# Patient Record
Sex: Female | Born: 1945
Health system: Southern US, Community
[De-identification: ages and names within clinical notes are randomized; demographics above are authoritative.]

## PROBLEM LIST (undated history)

## (undated) DIAGNOSIS — I10 Essential (primary) hypertension: Secondary | ICD-10-CM

## (undated) HISTORY — PX: BUNIONECTOMY: SHX129

---

## 1972-09-22 HISTORY — PX: OTHER SURGICAL HISTORY: SHX169

## 2003-08-15 ENCOUNTER — Encounter: Admission: RE | Admit: 2003-08-15 | Discharge: 2003-08-15 | Payer: Self-pay | Admitting: Internal Medicine

## 2004-04-23 ENCOUNTER — Encounter: Admission: RE | Admit: 2004-04-23 | Discharge: 2004-04-23 | Payer: Self-pay | Admitting: Internal Medicine

## 2005-02-12 ENCOUNTER — Encounter: Admission: RE | Admit: 2005-02-12 | Discharge: 2005-02-12 | Payer: Self-pay | Admitting: Internal Medicine

## 2005-06-19 ENCOUNTER — Encounter: Admission: RE | Admit: 2005-06-19 | Discharge: 2005-06-19 | Payer: Self-pay | Admitting: Internal Medicine

## 2005-09-09 ENCOUNTER — Encounter: Admission: RE | Admit: 2005-09-09 | Discharge: 2005-09-09 | Payer: Self-pay | Admitting: Obstetrics & Gynecology

## 2006-05-14 ENCOUNTER — Encounter: Admission: RE | Admit: 2006-05-14 | Discharge: 2006-05-14 | Payer: Self-pay | Admitting: Internal Medicine

## 2006-05-18 ENCOUNTER — Emergency Department (HOSPITAL_COMMUNITY): Admission: EM | Admit: 2006-05-18 | Discharge: 2006-05-18 | Payer: Self-pay | Admitting: Emergency Medicine

## 2006-10-30 ENCOUNTER — Encounter: Admission: RE | Admit: 2006-10-30 | Discharge: 2006-10-30 | Payer: Self-pay | Admitting: Gastroenterology

## 2006-11-09 ENCOUNTER — Encounter: Admission: RE | Admit: 2006-11-09 | Discharge: 2006-11-09 | Payer: Self-pay | Admitting: Gastroenterology

## 2008-03-09 ENCOUNTER — Encounter: Admission: RE | Admit: 2008-03-09 | Discharge: 2008-03-09 | Payer: Self-pay | Admitting: Internal Medicine

## 2008-09-01 ENCOUNTER — Encounter: Admission: RE | Admit: 2008-09-01 | Discharge: 2008-09-01 | Payer: Self-pay | Admitting: Orthopedic Surgery

## 2009-03-16 ENCOUNTER — Encounter: Admission: RE | Admit: 2009-03-16 | Discharge: 2009-03-16 | Payer: Self-pay | Admitting: Family Medicine

## 2009-07-12 ENCOUNTER — Emergency Department (HOSPITAL_COMMUNITY): Admission: EM | Admit: 2009-07-12 | Discharge: 2009-07-12 | Payer: Self-pay | Admitting: Emergency Medicine

## 2009-08-21 ENCOUNTER — Ambulatory Visit: Payer: Self-pay | Admitting: Cardiology

## 2009-08-21 DIAGNOSIS — R002 Palpitations: Secondary | ICD-10-CM | POA: Insufficient documentation

## 2009-08-21 DIAGNOSIS — R072 Precordial pain: Secondary | ICD-10-CM

## 2009-08-21 DIAGNOSIS — E669 Obesity, unspecified: Secondary | ICD-10-CM

## 2009-08-21 DIAGNOSIS — I10 Essential (primary) hypertension: Secondary | ICD-10-CM

## 2009-08-24 ENCOUNTER — Encounter: Admission: RE | Admit: 2009-08-24 | Discharge: 2009-08-24 | Payer: Self-pay | Admitting: Orthopaedic Surgery

## 2009-08-28 ENCOUNTER — Encounter: Admission: RE | Admit: 2009-08-28 | Discharge: 2009-09-19 | Payer: Self-pay | Admitting: Obstetrics & Gynecology

## 2009-10-11 ENCOUNTER — Encounter: Admission: RE | Admit: 2009-10-11 | Discharge: 2010-01-09 | Payer: Self-pay | Admitting: Obstetrics & Gynecology

## 2009-10-12 ENCOUNTER — Ambulatory Visit: Payer: Self-pay

## 2009-10-12 ENCOUNTER — Ambulatory Visit: Payer: Self-pay | Admitting: Cardiology

## 2009-10-18 ENCOUNTER — Telehealth (INDEPENDENT_AMBULATORY_CARE_PROVIDER_SITE_OTHER): Payer: Self-pay

## 2009-10-22 ENCOUNTER — Ambulatory Visit: Payer: Self-pay

## 2009-10-22 ENCOUNTER — Encounter (HOSPITAL_COMMUNITY): Admission: RE | Admit: 2009-10-22 | Discharge: 2009-12-26 | Payer: Self-pay | Admitting: Cardiology

## 2009-10-22 ENCOUNTER — Ambulatory Visit: Payer: Self-pay | Admitting: Cardiology

## 2009-10-29 ENCOUNTER — Ambulatory Visit: Payer: Self-pay

## 2010-01-24 ENCOUNTER — Encounter: Admission: RE | Admit: 2010-01-24 | Discharge: 2010-01-24 | Payer: Self-pay | Admitting: Family Medicine

## 2010-04-03 ENCOUNTER — Encounter: Admission: RE | Admit: 2010-04-03 | Discharge: 2010-04-03 | Payer: Self-pay | Admitting: Family Medicine

## 2010-10-22 NOTE — Assessment & Plan Note (Signed)
Summary: Cardiology Nuclear Study  Nuclear Med Background Indications for Stress Test: Evaluation for Ischemia  Indications Comments: 10/10 [facility] with CP, (-) enzymes.  History: Asthma, GXT  History Comments: 10/12/09 GXT: Unable to complete.  Symptoms: Chest Pain, DOE, Palpitations    Nuclear Pre-Procedure Cardiac Risk Factors: Family History - CAD, Hypertension, NIDDM, Obesity, Smoker Caffeine/Decaff Intake: None NPO After: 9:00 PM Lungs: clear IV 0.9% NS with Angio Cath: 22g     IV Site: (R) AC IV Started by: Irean Hong RN Chest Size (in) 44     Cup Size DD     Height (in): 62 Weight (lb): 271 BMI: 49.75  Nuclear Med Study 1 or 2 day study:  2 day     Stress Test Type:  Eugenie Birks Reading MD:  Olga Millers, MD     Referring MD:  J.Hochrein; cc to Renaye Rakers Resting Radionuclide:  Technetium 74m Tetrofosmin     Resting Radionuclide Dose:  33.0 mCi  Stress Radionuclide:  Technetium 64m Tetrofosmin     Stress Radionuclide Dose:  33.0 mCi   Stress Protocol   Lexiscan: 0.4 mg   Stress Test Technologist:  Milana Na EMT-P     Nuclear Technologist:  Burna Mortimer Deal RT-N  Rest Procedure  Myocardial perfusion imaging was performed at rest 45 minutes following the intravenous administration of Myoview Technetium 68m Tetrofosmin.  Stress Procedure  The patient received IV Lexiscan 0.4 mg over 15-seconds.  Myoview injected at 30-seconds.  There were no significant changes, + headache, sob, and chest tightness with infusion.  Quantitative spect images were obtained after a 45 minute delay.  QPS Raw Data Images:  There is interference from nuclear activity from structures below the diaphragm.  This does not affect the ability to read the study. Stress Images:  There is normal uptake in all areas. Rest Images:  Normal homogeneous uptake in all areas of the myocardium. Subtraction (SDS):  No evidence of ischemia. Transient Ischemic Dilatation:  .95  (Normal <1.22)  Lung/Heart  Ratio:  .30  (Normal <0.45)  Quantitative Gated Spect Images QGS EDV:  98 ml QGS ESV:  26 ml QGS EF:  73 % QGS cine images:  Normal wall motion.   Overall Impression  Exercise Capacity: Lexiscan study with no exercise. BP Response: Normal blood pressure response. Clinical Symptoms: There is chest pain ECG Impression: No significant ST segment change suggestive of ischemia. Overall Impression: There is no sign of scar or ischemia.  Appended Document: Cardiology Nuclear Study Negative study.  No further work up planned at this point.  Appended Document: Cardiology Nuclear Study PT AWARE OF THE RESULTS.

## 2010-10-22 NOTE — Progress Notes (Signed)
Summary: Nuc. Pre-Procedure  Phone Note Outgoing Call Call back at [facility] Phone [phone]   Call placed by: Irean Hong, RN,  October 18, 2009 1:48 PM Summary of Call: Reviewed information on Myoview Information Sheet (see scanned document for further details).  Spoke with patient.     Nuclear Med Background Indications for Stress Test: Evaluation for Ischemia  Indications Comments: 10/10 [facility] with CP, (-) enzymes.  History: Asthma, GXT  History Comments: 10/12/09 GXT: Unable to complete.  Symptoms: Chest Pain, DOE, Palpitations    Nuclear Pre-Procedure Cardiac Risk Factors: Family History - CAD, Hypertension, NIDDM, Obesity, Smoker Height (in): 62

## 2010-12-26 LAB — COMPREHENSIVE METABOLIC PANEL
ALT: 21 U/L (ref 0–35)
AST: 31 U/L (ref 0–37)
Albumin: 3.6 g/dL (ref 3.5–5.2)
Alkaline Phosphatase: 73 U/L (ref 39–117)
BUN: 19 mg/dL (ref 6–23)
CO2: 28 mEq/L (ref 19–32)
Calcium: 9 mg/dL (ref 8.4–10.5)
Chloride: 99 mEq/L (ref 96–112)
Creatinine, Ser: 0.83 mg/dL (ref 0.4–1.2)
GFR calc Af Amer: >60 mL/min (ref >60)
GFR calc non Af Amer: >60 mL/min (ref >60)
Glucose, Bld: 94 mg/dL (ref 70–99)
Potassium: 3.8 mEq/L (ref 3.5–5.1)
Sodium: 135 mEq/L (ref 135–145)
Total Bilirubin: 0.8 mg/dL (ref 0.3–1.2)
Total Protein: 7.4 g/dL (ref 6.0–8.3)

## 2010-12-26 LAB — DIFFERENTIAL
Basophils Absolute: 0 10*3/uL (ref 0.0–0.1)
Basophils Relative: 0 % (ref 0–1)
Eosinophils Absolute: 0.2 10*3/uL (ref 0.0–0.7)
Eosinophils Relative: 3 % (ref 0–5)
Lymphocytes Relative: 46 % (ref 12–46)
Lymphs Abs: 4.4 10*3/uL — ABNORMAL HIGH (ref 0.7–4.0)
Monocytes Absolute: 0.7 10*3/uL (ref 0.1–1.0)
Monocytes Relative: 8 % (ref 3–12)
Neutro Abs: 4.2 10*3/uL (ref 1.7–7.7)
Neutrophils Relative %: 44 % (ref 43–77)

## 2010-12-26 LAB — CBC
HCT: 41.2 % (ref 36.0–46.0)
Hemoglobin: 13.7 g/dL (ref 12.0–15.0)
MCHC: 33.2 g/dL (ref 30.0–36.0)
MCV: 86.5 fL (ref 78.0–100.0)
Platelets: 284 10*3/uL (ref 150–400)
RBC: 4.76 MIL/uL (ref 3.87–5.11)
RDW: 14.4 % (ref 11.5–15.5)
WBC: 9.6 10*3/uL (ref 4.0–10.5)

## 2010-12-26 LAB — POCT CARDIAC MARKERS
CKMB, poc: 1.4 ng/mL (ref 1.0–8.0)
CKMB, poc: <1 ng/mL — ABNORMAL LOW (ref 1.0–8.0)
Myoglobin, poc: 133 ng/mL (ref 12–200)
Myoglobin, poc: 139 ng/mL (ref 12–200)
Troponin i, poc: <0.05 ng/mL (ref 0.00–0.09)
Troponin i, poc: <0.05 ng/mL (ref 0.00–0.09)

## 2010-12-26 LAB — D-DIMER, QUANTITATIVE: D-Dimer, Quant: 0.85 ug/mL-FEU — ABNORMAL HIGH (ref 0.00–0.48)

## 2011-04-05 ENCOUNTER — Emergency Department (HOSPITAL_COMMUNITY)
Admission: EM | Admit: 2011-04-05 | Discharge: 2011-04-05 | Disposition: A | Discharge status: Home / Self Care | Payer: Managed Care, Other (non HMO) | Attending: Emergency Medicine | Admitting: Emergency Medicine

## 2011-04-05 ENCOUNTER — Emergency Department (HOSPITAL_COMMUNITY): Payer: Managed Care, Other (non HMO)

## 2011-04-05 DIAGNOSIS — M25569 Pain in unspecified knee: Secondary | ICD-10-CM | POA: Insufficient documentation

## 2011-04-05 DIAGNOSIS — IMO0002 Reserved for concepts with insufficient information to code with codable children: Secondary | ICD-10-CM | POA: Insufficient documentation

## 2011-04-05 DIAGNOSIS — M1710 Unilateral primary osteoarthritis, unspecified knee: Secondary | ICD-10-CM | POA: Insufficient documentation

## 2011-05-05 ENCOUNTER — Other Ambulatory Visit: Payer: Self-pay | Admitting: Family Medicine

## 2011-05-05 DIAGNOSIS — Z1231 Encounter for screening mammogram for malignant neoplasm of breast: Secondary | ICD-10-CM

## 2011-05-15 ENCOUNTER — Ambulatory Visit
Admission: RE | Admit: 2011-05-15 | Discharge: 2011-05-15 | Disposition: A | Discharge status: Home / Self Care | Payer: Managed Care, Other (non HMO) | Source: Ambulatory Visit | Attending: Family Medicine | Admitting: Family Medicine

## 2011-05-15 DIAGNOSIS — Z1231 Encounter for screening mammogram for malignant neoplasm of breast: Secondary | ICD-10-CM

## 2011-06-20 ENCOUNTER — Emergency Department (HOSPITAL_COMMUNITY): Payer: Managed Care, Other (non HMO)

## 2011-06-20 ENCOUNTER — Emergency Department (HOSPITAL_COMMUNITY)
Admission: EM | Admit: 2011-06-20 | Discharge: 2011-06-20 | Disposition: A | Discharge status: Home / Self Care | Payer: Managed Care, Other (non HMO) | Attending: Emergency Medicine | Admitting: Emergency Medicine

## 2011-06-20 DIAGNOSIS — R110 Nausea: Secondary | ICD-10-CM | POA: Insufficient documentation

## 2011-06-20 DIAGNOSIS — R1013 Epigastric pain: Secondary | ICD-10-CM | POA: Insufficient documentation

## 2011-06-20 DIAGNOSIS — I10 Essential (primary) hypertension: Secondary | ICD-10-CM | POA: Insufficient documentation

## 2011-06-20 DIAGNOSIS — R1011 Right upper quadrant pain: Secondary | ICD-10-CM | POA: Insufficient documentation

## 2011-06-20 DIAGNOSIS — R509 Fever, unspecified: Secondary | ICD-10-CM | POA: Insufficient documentation

## 2011-06-20 LAB — CBC
HCT: 38.6 % (ref 36.0–46.0)
MCHC: 33.4 g/dL (ref 30.0–36.0)
MCV: 84.5 fL (ref 78.0–100.0)
Platelets: 251 10*3/uL (ref 150–400)
RDW: 14.4 % (ref 11.5–15.5)
WBC: 11.6 10*3/uL — ABNORMAL HIGH (ref 4.0–10.5)

## 2011-06-20 LAB — URINALYSIS, ROUTINE W REFLEX MICROSCOPIC
Glucose, UA: NEGATIVE mg/dL
Nitrite: NEGATIVE
Specific Gravity, Urine: 1.022 (ref 1.005–1.030)
pH: 6 (ref 5.0–8.0)

## 2011-06-20 LAB — COMPREHENSIVE METABOLIC PANEL
AST: 44 U/L — ABNORMAL HIGH (ref 0–37)
Albumin: 3.4 g/dL — ABNORMAL LOW (ref 3.5–5.2)
BUN: 16 mg/dL (ref 6–23)
Chloride: 97 mEq/L (ref 96–112)
Creatinine, Ser: 0.72 mg/dL (ref 0.50–1.10)
Total Bilirubin: 0.6 mg/dL (ref 0.3–1.2)
Total Protein: 7.4 g/dL (ref 6.0–8.3)

## 2011-06-20 LAB — LIPASE, BLOOD: Lipase: 51 U/L (ref 11–59)

## 2011-06-20 LAB — DIFFERENTIAL
Basophils Absolute: 0 10*3/uL (ref 0.0–0.1)
Eosinophils Absolute: 0.1 10*3/uL (ref 0.0–0.7)
Eosinophils Relative: 1 % (ref 0–5)
Monocytes Absolute: 0.9 10*3/uL (ref 0.1–1.0)

## 2011-06-20 LAB — URINE MICROSCOPIC-ADD ON

## 2011-11-05 ENCOUNTER — Other Ambulatory Visit: Payer: Self-pay | Admitting: Gastroenterology

## 2011-11-10 ENCOUNTER — Ambulatory Visit
Admission: RE | Admit: 2011-11-10 | Discharge: 2011-11-10 | Disposition: A | Discharge status: Home / Self Care | Payer: Medicare Other | Source: Ambulatory Visit | Attending: Gastroenterology | Admitting: Gastroenterology

## 2011-11-10 DIAGNOSIS — R945 Abnormal results of liver function studies: Secondary | ICD-10-CM | POA: Diagnosis not present

## 2011-11-12 ENCOUNTER — Other Ambulatory Visit: Payer: Self-pay | Admitting: Gastroenterology

## 2011-11-12 DIAGNOSIS — N289 Disorder of kidney and ureter, unspecified: Secondary | ICD-10-CM

## 2011-11-19 ENCOUNTER — Ambulatory Visit
Admission: RE | Admit: 2011-11-19 | Discharge: 2011-11-19 | Disposition: A | Discharge status: Home / Self Care | Payer: Medicare Other | Source: Ambulatory Visit | Attending: Gastroenterology | Admitting: Gastroenterology

## 2011-11-19 DIAGNOSIS — N289 Disorder of kidney and ureter, unspecified: Secondary | ICD-10-CM | POA: Diagnosis not present

## 2011-11-19 MED ORDER — IOHEXOL 300 MG/ML  SOLN
125.0000 mL | Freq: Once | INTRAMUSCULAR | Status: AC | PRN
  Administered 2011-11-19: 125 mL via INTRAVENOUS

## 2012-01-14 ENCOUNTER — Encounter (HOSPITAL_COMMUNITY): Payer: Self-pay | Admitting: *Deleted

## 2012-01-14 ENCOUNTER — Emergency Department (HOSPITAL_COMMUNITY): Payer: Medicare Other

## 2012-01-14 ENCOUNTER — Emergency Department (HOSPITAL_COMMUNITY)
Admission: EM | Admit: 2012-01-14 | Discharge: 2012-01-15 | Disposition: A | Discharge status: Home / Self Care | Payer: Medicare Other | Attending: Emergency Medicine | Admitting: Emergency Medicine

## 2012-01-14 DIAGNOSIS — M542 Cervicalgia: Secondary | ICD-10-CM | POA: Diagnosis not present

## 2012-01-14 DIAGNOSIS — J45909 Unspecified asthma, uncomplicated: Secondary | ICD-10-CM | POA: Insufficient documentation

## 2012-01-14 DIAGNOSIS — R51 Headache: Secondary | ICD-10-CM | POA: Insufficient documentation

## 2012-01-14 DIAGNOSIS — S41009A Unspecified open wound of unspecified shoulder, initial encounter: Secondary | ICD-10-CM | POA: Diagnosis not present

## 2012-01-14 DIAGNOSIS — S0990XA Unspecified injury of head, initial encounter: Secondary | ICD-10-CM | POA: Diagnosis not present

## 2012-01-14 DIAGNOSIS — W010XXA Fall on same level from slipping, tripping and stumbling without subsequent striking against object, initial encounter: Secondary | ICD-10-CM | POA: Insufficient documentation

## 2012-01-14 DIAGNOSIS — G4730 Sleep apnea, unspecified: Secondary | ICD-10-CM | POA: Diagnosis not present

## 2012-01-14 DIAGNOSIS — S0083XA Contusion of other part of head, initial encounter: Secondary | ICD-10-CM | POA: Insufficient documentation

## 2012-01-14 DIAGNOSIS — S1093XA Contusion of unspecified part of neck, initial encounter: Secondary | ICD-10-CM | POA: Diagnosis not present

## 2012-01-14 DIAGNOSIS — I10 Essential (primary) hypertension: Secondary | ICD-10-CM | POA: Insufficient documentation

## 2012-01-14 DIAGNOSIS — Y9210 Unspecified residential institution as the place of occurrence of the external cause: Secondary | ICD-10-CM | POA: Insufficient documentation

## 2012-01-14 DIAGNOSIS — S0003XA Contusion of scalp, initial encounter: Secondary | ICD-10-CM | POA: Diagnosis not present

## 2012-01-14 DIAGNOSIS — S022XXA Fracture of nasal bones, initial encounter for closed fracture: Secondary | ICD-10-CM | POA: Diagnosis not present

## 2012-01-14 DIAGNOSIS — W19XXXA Unspecified fall, initial encounter: Secondary | ICD-10-CM

## 2012-01-14 HISTORY — DX: Essential (primary) hypertension: I10

## 2012-01-14 MED ORDER — KETOROLAC TROMETHAMINE 30 MG/ML IJ SOLN
30.0000 mg | Freq: Once | INTRAMUSCULAR | Status: AC
  Administered 2012-01-15: 30 mg via INTRAMUSCULAR
  Filled 2012-01-14: qty 1

## 2012-01-14 NOTE — ED Notes (Signed)
EMS reports pt was visiting family at nursing home, pt tripped and fell face first on floor, no LOC, hematoma to forehead, abrasions

## 2012-01-14 NOTE — ED Notes (Signed)
Patient states that she was walking and tripped and fell. Unsure if she blacked out. States she fell face forward and hit a tile cement floor. Hematoma noted to forehead.

## 2012-01-14 NOTE — ED Notes (Signed)
ZOX:[id number]<AV> Expected date:01/14/12<BR> Expected time:<BR> Means of arrival:<BR> Comments:<BR> EMS 130 GC - general illness

## 2012-01-14 NOTE — ED Provider Notes (Signed)
History     CSN: [id number]  Arrival date & time 01/14/12  2204   First MD Initiated Contact with Patient 01/14/12 2256      Chief Complaint  Patient presents with  . Fall    HPI  History provided by the patient. Patient 66 year old female with history of hypertension and sleep apnea who presents after a fall. Patient reports visiting her mother at nursing home and while she was helping get a snack she stumbled or tripped with her feet getting caught up on something in the room and fell forward. Patient reports landing on her chest and hitting her forehead against the ground. Patient denies LOC. Patient complains of swelling to her for head above her nose area. Patient also reports having a bloody nose following the injury. Bleeding has stopped. Patient denies any other significant injury. She denies neck pain. She denies any confusion, numbness or weakness in extremities. Denies vision problems. Patient has not done anything for pain. Denies any aggravating or alleviating factors    Past Medical History  Diagnosis Date  . Hypertension   . Asthma   . Sleep apnea   . CPAP (continuous positive airway pressure) dependence     Past Surgical History  Procedure Date  . Bunionectomy     No family history on file.  History  Substance Use Topics  . Smoking status: Former Games developer  . Smokeless tobacco: Not on file  . Alcohol Use: No    OB History    Grav Para Term Preterm Abortions TAB SAB Ect Mult Living                  Review of Systems  HENT: Positive for nosebleeds and neck stiffness. Negative for neck pain.   Eyes: Negative for pain and visual disturbance.  Gastrointestinal: Negative for nausea and vomiting.  Neurological: Positive for headaches. Negative for dizziness, facial asymmetry, speech difficulty, weakness, light-headedness and numbness.  Psychiatric/Behavioral: Negative for confusion.    Allergies  Review of patient's allergies indicates no known  allergies.  Home Medications  No current outpatient prescriptions on file.  BP 144/70  Pulse 61  Temp(Src) 98.5 F (36.9 C) (Oral)  Resp 18  Wt 262 lb 8 oz (119.069 kg)  SpO2 99%  Physical Exam  Nursing note and vitals reviewed. Constitutional: She is oriented to person, place, and time. She appears well-developed and well-nourished. No distress.  HENT:  Head: Normocephalic.       4 cm hematoma to forehead. Small abrasions without bleeding. Pain over nasal bones without obvious deformity. Dry blood in the nostrils bilaterally. No septal hematoma. No active bleeding. No raccoon eyes or battle sign.  Eyes: Conjunctivae and EOM are normal. Pupils are equal, round, and reactive to light.  Neck: Normal range of motion. Neck supple.       Mild posterior tenderness.  Cardiovascular: Normal rate and regular rhythm.   Pulmonary/Chest: Effort normal and breath sounds normal. No respiratory distress. She has no wheezes.  Neurological: She is alert and oriented to person, place, and time. She has normal strength. No cranial nerve deficit or sensory deficit.  Skin: Skin is warm and dry. No rash noted.  Psychiatric: She has a normal mood and affect. Her behavior is normal.    ED Course  Procedures     Ct Head Wo Contrast  01/15/2012  *RADIOLOGY REPORT*  Clinical Data:  Fall.  CT HEAD WITHOUT CONTRAST CT CERVICAL SPINE WITHOUT CONTRAST  Technique:  Multidetector CT imaging  of the head and cervical spine was performed following the standard protocol without intravenous contrast.  Multiplanar CT image reconstructions of the cervical spine were also generated.  Comparison:   None  CT HEAD  Findings: The ventricles and sulci appear symmetrical.  No mass effect or midline shift.  No abnormal extra-axial fluid collections.  Vague focal area of low attenuation in the right insular cortex region may represent focal area of ischemia.  Age is indeterminate.  Gray-white matter junctions are distinct.  Basal  cisterns are not effaced.  No evidence of acute intracranial hemorrhage.  Subcutaneous scalp hematoma over the anterior frontal bone without underlying skull fracture.  Visualized paranasal sinuses and mastoid air cells are not opacified.  Mild depression of nasal bone fracture.  IMPRESSION: Focal subcutaneous scalp hematoma.  Depressed nasal bone fracture. No acute intracranial abnormalities.  Nonspecific focal low attenuation change in the right insular cortex may represent ischemia of unknown etiology.  No acute intracranial hemorrhage.  CT CERVICAL SPINE  Findings: There is reversal of the usual cervical lordosis, centered at the C5 level.  This may be due to patient positioning or degenerative change but ligamentous injury or muscle spasm can also have this appearance.  Normal alignment of the facet joints. No abnormal anterior subluxation.  Degenerative changes in the cervical spine with narrowed C4-5, C5-6, and C6-7 interspaces and associated endplate hypertrophic changes.  Prominent posterior hypertroph of the superior endplate of C6 causes some effacement the anterior thecal sac.  No vertebral compression deformities.  No prevertebral soft tissue swelling.  Lateral masses of C1 are symmetrical.  The odontoid process is intact.  Degenerative changes at C1-2 with pannus formation.  No focal bone lesion or bone destruction.  No paraspinal soft tissue infiltration.  Bone cortex and trabecular architecture appear intact.  IMPRESSION: Reversal of the usual cervical lordosis which might be due to patient positioning but ligamentous injury or muscle spasm can also have this appearance.  Degenerative changes in the cervical spine. No displaced fractures identified.  Original Report Authenticated By: Marlon Pel, M.D.   Ct Cervical Spine Wo Contrast  01/15/2012  *RADIOLOGY REPORT*  Clinical Data:  Fall.  CT HEAD WITHOUT CONTRAST CT CERVICAL SPINE WITHOUT CONTRAST  Technique:  Multidetector CT imaging of the  head and cervical spine was performed following the standard protocol without intravenous contrast.  Multiplanar CT image reconstructions of the cervical spine were also generated.  Comparison:   None  CT HEAD  Findings: The ventricles and sulci appear symmetrical.  No mass effect or midline shift.  No abnormal extra-axial fluid collections.  Vague focal area of low attenuation in the right insular cortex region may represent focal area of ischemia.  Age is indeterminate.  Gray-white matter junctions are distinct.  Basal cisterns are not effaced.  No evidence of acute intracranial hemorrhage.  Subcutaneous scalp hematoma over the anterior frontal bone without underlying skull fracture.  Visualized paranasal sinuses and mastoid air cells are not opacified.  Mild depression of nasal bone fracture.  IMPRESSION: Focal subcutaneous scalp hematoma.  Depressed nasal bone fracture. No acute intracranial abnormalities.  Nonspecific focal low attenuation change in the right insular cortex may represent ischemia of unknown etiology.  No acute intracranial hemorrhage.  CT CERVICAL SPINE  Findings: There is reversal of the usual cervical lordosis, centered at the C5 level.  This may be due to patient positioning or degenerative change but ligamentous injury or muscle spasm can also have this appearance.  Normal alignment of  the facet joints. No abnormal anterior subluxation.  Degenerative changes in the cervical spine with narrowed C4-5, C5-6, and C6-7 interspaces and associated endplate hypertrophic changes.  Prominent posterior hypertroph of the superior endplate of C6 causes some effacement the anterior thecal sac.  No vertebral compression deformities.  No prevertebral soft tissue swelling.  Lateral masses of C1 are symmetrical.  The odontoid process is intact.  Degenerative changes at C1-2 with pannus formation.  No focal bone lesion or bone destruction.  No paraspinal soft tissue infiltration.  Bone cortex and trabecular  architecture appear intact.  IMPRESSION: Reversal of the usual cervical lordosis which might be due to patient positioning but ligamentous injury or muscle spasm can also have this appearance.  Degenerative changes in the cervical spine. No displaced fractures identified.  Original Report Authenticated By: Marlon Pel, M.D.     1. Scalp hematoma   2. Fall   3. Nasal bone fracture       MDM  11;00PM patient seen and evaluated. Patient in no acute distress. Patient with normal nonfocal neuro exam.        Angus Seller, PA 01/15/12 [phone]

## 2012-01-15 MED ORDER — HYDROCODONE-ACETAMINOPHEN 5-325 MG PO TABS: 1.0000 | ORAL_TABLET | ORAL | Status: AC | PRN

## 2012-01-15 NOTE — ED Provider Notes (Signed)
Medical screening examination/treatment/procedure(s) were performed by non-physician practitioner and as supervising physician I was immediately available for consultation/collaboration.   Forbes Cellar, MD 01/15/12 [phone]

## 2012-01-15 NOTE — ED Notes (Signed)
PA Dammen at bedside. 

## 2012-01-15 NOTE — Discharge Instructions (Signed)
Your CAT scans today showed a slight nasal bone fracture. There is no other skull fracture or brain injury. At this time your providers he may return home. Use ice over your swelling. Use the pain medications were prescribed as instructed for severe pains. Do not drive or operate machinery while taking this pain medication. Please followup with a primary care provider. Return for any worsening or new concerning symptoms.   Head Injury, Adult You have had a head injury that does not appear serious at this time. A concussion is a state of changed mental ability, usually from a blow to the head. You should take clear liquids for the rest of the day and then resume your regular diet. You should not take sedatives or alcoholic beverages for as long as directed by your caregiver after discharge. After injuries such as yours, most problems occur within the first 24 hours. SYMPTOMS These minor symptoms may be experienced after discharge:  Memory difficulties.   Dizziness.   Headaches.   Double vision.   Hearing difficulties.   Depression.   Tiredness.   Weakness.   Difficulty with concentration.  If you experience any of these problems, you should not be alarmed. A concussion requires a few days for recovery. Many patients with head injuries frequently experience such symptoms. Usually, these problems disappear without medical care. If symptoms last for more than one day, notify your caregiver. See your caregiver sooner if symptoms are becoming worse rather than better. HOME CARE INSTRUCTIONS   During the next 24 hours you must stay with someone who can watch you for the warning signs listed below.  Although it is unlikely that serious side effects will occur, you should be aware of signs and symptoms which may necessitate your return to this location. Side effects may occur up to 7 - 10 days following the injury. It is important for you to carefully monitor your condition and contact your  caregiver or seek immediate medical attention if there is a change in your condition. SEEK IMMEDIATE MEDICAL CARE IF:   There is confusion or drowsiness.   You can not awaken the injured person.   There is nausea (feeling sick to your stomach) or continued, forceful vomiting.   You notice dizziness or unsteadiness which is getting worse, or inability to walk.   You have convulsions or unconsciousness.   You experience severe, persistent headaches not relieved by over-the-counter or prescription medicines for pain. (Do not take aspirin as this impairs clotting abilities). Take other pain medications only as directed.   You can not use arms or legs normally.   There is clear or bloody discharge from the nose or ears.  MAKE SURE YOU:   Understand these instructions.   Will watch your condition.   Will get help right away if you are not doing well or get worse.  Document Released: 09/08/2005 Document Revised: 08/28/2011 Document Reviewed: 07/27/2009 [facility] Patient Information 2012 [place], [place].   Nasal Fracture A nasal fracture is a break or crack in the bones of the nose. A minor break usually heals in a month. You often will receive black eyes from a nasal fracture. This is not a cause for concern. The black eyes will go away over 1 to 2 weeks.  DIAGNOSIS  Your caregiver may want to examine you if you are concerned about a fracture of the nose. X-rays of the nose may not show a nasal fracture even when one is present. Sometimes your caregiver must wait 1  to 5 days after the injury to re-check the nose for alignment and to take additional X-rays. Sometimes the caregiver must wait until the swelling has gone down. TREATMENT Minor fractures that have caused no deformity often do not require treatment. More serious fractures where bones are displaced may require surgery. This will take place after the swelling is gone. Surgery will stabilize and align the fracture. HOME CARE  INSTRUCTIONS   Put ice on the injured area.   Put ice in a plastic bag.   Place a towel between your skin and the bag.   Leave the ice on for 15 to 20 minutes, 3 to 4 times a day.   Take medications as directed by your caregiver.   Only take over-the-counter or prescription medicines for pain, discomfort, or fever as directed by your caregiver.   If your nose starts bleeding, squeeze the soft parts of the nose against the center wall while you are sitting in an upright position for 10 minutes.   Contact sports should be avoided for at least 3 to 4 weeks or as directed by your caregiver.  SEEK MEDICAL CARE IF:  Your pain increases or becomes severe.   You continue to have nosebleeds.   The shape of your nose does not return to normal within 5 days.   You have pus draining from the nose.  SEEK IMMEDIATE MEDICAL CARE IF:   You have bleeding from your nose that does not stop after 20 minutes of pinching the nostrils closed and keeping ice on the nose.   You have clear fluid draining from your nose.   You notice a grape-like swelling on the dividing wall between the nostrils (septum). This is a collection of blood (hematoma) that must be drained to help prevent infection.   You have difficulty moving your eyes.   You have recurrent vomiting.  Document Released: 09/05/2000 Document Revised: 08/28/2011 Document Reviewed: 12/23/2010 [facility] Patient Information 2012 [place], [place].    RESOURCE GUIDE  Dental Problems  Patients with Medicaid: [facility] [phone] [address]                                           [phone] [address] Phone:  [phone]                                                  Phone:  [phone]  If unable to pay or uninsured, contact:  [facility] or [facility]. to become qualified for the adult dental clinic.  Chronic Pain Problems Contact [facility]   [phone] Patients need to be referred by their primary care doctor.  Insufficient Money for Medicine Contact [employer]:  call "211" or [facility] (434)329-2282.  No Primary Care Doctor Call [facility]  7043498179 Other agencies that provide inexpensive medical care    [facility]  [phone]    [facility]  [phone]    [facility]  661-135-1793    [facility]  336-654-4374    [facility]  9143495102  [facility]  [phone]  Psychological Services [facility]  [phone] [facility]  [phone] [facility]   [phone] (emergency services ([phone])  Substance Abuse Resources [facility]  3300197945 [facility] (351)423-1388 [facility] ([phone] Floydene Flock 330-830-7559 Residential & Outpatient Substance Abuse Program  864-535-1550  Abuse/Neglect [facility] Child Abuse Hotline 949-775-0797 [facility] Child Abuse Hotline (604) 826-0266 (After Hours)  Emergency Shelter [facility] 912-430-4505  M[facility] 856-210-9676 [facility] (217)820-2225  MRSA Hotline #:   657-406-5383    [facility] Resources  [facility]     [employer]                          [facility] Dept. [address] [place]                       [address]      [address]  Blondell Reveal Phone:  [phone]                                   Phone:  [phone]                 Phone:  [phone]  [facility] Mental Health Phone:  [phone]  [facility] Child Abuse Hotline 2697826479 [phone] (After Hours)

## 2012-01-22 DIAGNOSIS — S022XXA Fracture of nasal bones, initial encounter for closed fracture: Secondary | ICD-10-CM | POA: Diagnosis not present

## 2012-01-30 DIAGNOSIS — H2510 Age-related nuclear cataract, unspecified eye: Secondary | ICD-10-CM | POA: Diagnosis not present

## 2012-02-06 DIAGNOSIS — E669 Obesity, unspecified: Secondary | ICD-10-CM | POA: Diagnosis not present

## 2012-02-06 DIAGNOSIS — R51 Headache: Secondary | ICD-10-CM | POA: Diagnosis not present

## 2012-02-06 DIAGNOSIS — G4730 Sleep apnea, unspecified: Secondary | ICD-10-CM | POA: Diagnosis not present

## 2012-02-06 DIAGNOSIS — I10 Essential (primary) hypertension: Secondary | ICD-10-CM | POA: Diagnosis not present

## 2012-03-24 DIAGNOSIS — E119 Type 2 diabetes mellitus without complications: Secondary | ICD-10-CM | POA: Diagnosis not present

## 2012-03-24 DIAGNOSIS — E7800 Pure hypercholesterolemia, unspecified: Secondary | ICD-10-CM | POA: Diagnosis not present

## 2012-03-24 DIAGNOSIS — E669 Obesity, unspecified: Secondary | ICD-10-CM | POA: Diagnosis not present

## 2012-03-24 DIAGNOSIS — M1990 Unspecified osteoarthritis, unspecified site: Secondary | ICD-10-CM | POA: Diagnosis not present

## 2012-03-24 DIAGNOSIS — E889 Metabolic disorder, unspecified: Secondary | ICD-10-CM | POA: Diagnosis not present

## 2012-04-16 DIAGNOSIS — M1710 Unilateral primary osteoarthritis, unspecified knee: Secondary | ICD-10-CM | POA: Diagnosis not present

## 2012-04-16 DIAGNOSIS — M25569 Pain in unspecified knee: Secondary | ICD-10-CM | POA: Diagnosis not present

## 2012-05-14 DIAGNOSIS — M25569 Pain in unspecified knee: Secondary | ICD-10-CM | POA: Diagnosis not present

## 2012-05-14 DIAGNOSIS — M1710 Unilateral primary osteoarthritis, unspecified knee: Secondary | ICD-10-CM | POA: Diagnosis not present

## 2012-05-28 DIAGNOSIS — M1710 Unilateral primary osteoarthritis, unspecified knee: Secondary | ICD-10-CM | POA: Diagnosis not present

## 2012-06-04 DIAGNOSIS — M1710 Unilateral primary osteoarthritis, unspecified knee: Secondary | ICD-10-CM | POA: Diagnosis not present

## 2012-06-21 DIAGNOSIS — M1710 Unilateral primary osteoarthritis, unspecified knee: Secondary | ICD-10-CM | POA: Diagnosis not present

## 2012-06-23 DIAGNOSIS — E669 Obesity, unspecified: Secondary | ICD-10-CM | POA: Diagnosis not present

## 2012-06-23 DIAGNOSIS — R51 Headache: Secondary | ICD-10-CM | POA: Diagnosis not present

## 2012-06-23 DIAGNOSIS — I10 Essential (primary) hypertension: Secondary | ICD-10-CM | POA: Diagnosis not present

## 2012-06-23 DIAGNOSIS — G4730 Sleep apnea, unspecified: Secondary | ICD-10-CM | POA: Diagnosis not present

## 2012-06-25 ENCOUNTER — Other Ambulatory Visit: Payer: Self-pay | Admitting: Family Medicine

## 2012-06-25 DIAGNOSIS — Z1231 Encounter for screening mammogram for malignant neoplasm of breast: Secondary | ICD-10-CM

## 2012-06-28 DIAGNOSIS — M1710 Unilateral primary osteoarthritis, unspecified knee: Secondary | ICD-10-CM | POA: Diagnosis not present

## 2012-07-16 ENCOUNTER — Ambulatory Visit: Payer: Medicare Other

## 2012-07-29 ENCOUNTER — Ambulatory Visit
Admission: RE | Admit: 2012-07-29 | Discharge: 2012-07-29 | Disposition: A | Discharge status: Home / Self Care | Payer: Medicare Other | Source: Ambulatory Visit | Attending: Family Medicine | Admitting: Family Medicine

## 2012-07-29 DIAGNOSIS — Z1231 Encounter for screening mammogram for malignant neoplasm of breast: Secondary | ICD-10-CM | POA: Diagnosis not present

## 2012-08-04 DIAGNOSIS — Z1151 Encounter for screening for human papillomavirus (HPV): Secondary | ICD-10-CM | POA: Diagnosis not present

## 2012-08-04 DIAGNOSIS — Z124 Encounter for screening for malignant neoplasm of cervix: Secondary | ICD-10-CM | POA: Diagnosis not present

## 2012-08-04 DIAGNOSIS — Z01419 Encounter for gynecological examination (general) (routine) without abnormal findings: Secondary | ICD-10-CM | POA: Diagnosis not present

## 2012-08-18 DIAGNOSIS — E119 Type 2 diabetes mellitus without complications: Secondary | ICD-10-CM | POA: Diagnosis not present

## 2012-08-18 DIAGNOSIS — R21 Rash and other nonspecific skin eruption: Secondary | ICD-10-CM | POA: Diagnosis not present

## 2012-08-18 DIAGNOSIS — Z23 Encounter for immunization: Secondary | ICD-10-CM | POA: Diagnosis not present

## 2012-10-08 DIAGNOSIS — B373 Candidiasis of vulva and vagina: Secondary | ICD-10-CM | POA: Diagnosis not present

## 2012-10-08 DIAGNOSIS — Z113 Encounter for screening for infections with a predominantly sexual mode of transmission: Secondary | ICD-10-CM | POA: Diagnosis not present

## 2012-10-26 DIAGNOSIS — E7800 Pure hypercholesterolemia, unspecified: Secondary | ICD-10-CM | POA: Diagnosis not present

## 2012-10-26 DIAGNOSIS — I10 Essential (primary) hypertension: Secondary | ICD-10-CM | POA: Diagnosis not present

## 2012-10-26 DIAGNOSIS — E119 Type 2 diabetes mellitus without complications: Secondary | ICD-10-CM | POA: Diagnosis not present

## 2012-10-26 DIAGNOSIS — E669 Obesity, unspecified: Secondary | ICD-10-CM | POA: Diagnosis not present

## 2012-10-29 DIAGNOSIS — L738 Other specified follicular disorders: Secondary | ICD-10-CM | POA: Diagnosis not present

## 2013-01-28 ENCOUNTER — Emergency Department (HOSPITAL_COMMUNITY): Payer: No Typology Code available for payment source

## 2013-01-28 ENCOUNTER — Encounter (HOSPITAL_COMMUNITY): Payer: Self-pay | Admitting: *Deleted

## 2013-01-28 ENCOUNTER — Emergency Department (HOSPITAL_COMMUNITY)
Admission: EM | Admit: 2013-01-28 | Discharge: 2013-01-28 | Disposition: A | Discharge status: Home / Self Care | Payer: No Typology Code available for payment source | Attending: Emergency Medicine | Admitting: Emergency Medicine

## 2013-01-28 DIAGNOSIS — M5136 Other intervertebral disc degeneration, lumbar region: Secondary | ICD-10-CM

## 2013-01-28 DIAGNOSIS — R209 Unspecified disturbances of skin sensation: Secondary | ICD-10-CM | POA: Insufficient documentation

## 2013-01-28 DIAGNOSIS — Z7982 Long term (current) use of aspirin: Secondary | ICD-10-CM | POA: Insufficient documentation

## 2013-01-28 DIAGNOSIS — IMO0002 Reserved for concepts with insufficient information to code with codable children: Secondary | ICD-10-CM | POA: Diagnosis not present

## 2013-01-28 DIAGNOSIS — Z79899 Other long term (current) drug therapy: Secondary | ICD-10-CM | POA: Insufficient documentation

## 2013-01-28 DIAGNOSIS — S199XXA Unspecified injury of neck, initial encounter: Secondary | ICD-10-CM | POA: Diagnosis not present

## 2013-01-28 DIAGNOSIS — T148XXA Other injury of unspecified body region, initial encounter: Secondary | ICD-10-CM

## 2013-01-28 DIAGNOSIS — J45909 Unspecified asthma, uncomplicated: Secondary | ICD-10-CM | POA: Diagnosis not present

## 2013-01-28 DIAGNOSIS — M51379 Other intervertebral disc degeneration, lumbosacral region without mention of lumbar back pain or lower extremity pain: Secondary | ICD-10-CM | POA: Insufficient documentation

## 2013-01-28 DIAGNOSIS — G4730 Sleep apnea, unspecified: Secondary | ICD-10-CM | POA: Insufficient documentation

## 2013-01-28 DIAGNOSIS — I10 Essential (primary) hypertension: Secondary | ICD-10-CM | POA: Insufficient documentation

## 2013-01-28 DIAGNOSIS — M542 Cervicalgia: Secondary | ICD-10-CM | POA: Diagnosis not present

## 2013-01-28 DIAGNOSIS — S4980XA Other specified injuries of shoulder and upper arm, unspecified arm, initial encounter: Secondary | ICD-10-CM | POA: Diagnosis not present

## 2013-01-28 DIAGNOSIS — S335XXA Sprain of ligaments of lumbar spine, initial encounter: Secondary | ICD-10-CM | POA: Diagnosis not present

## 2013-01-28 DIAGNOSIS — Y9389 Activity, other specified: Secondary | ICD-10-CM | POA: Insufficient documentation

## 2013-01-28 DIAGNOSIS — M25519 Pain in unspecified shoulder: Secondary | ICD-10-CM | POA: Diagnosis not present

## 2013-01-28 DIAGNOSIS — Y92410 Unspecified street and highway as the place of occurrence of the external cause: Secondary | ICD-10-CM | POA: Insufficient documentation

## 2013-01-28 DIAGNOSIS — M5137 Other intervertebral disc degeneration, lumbosacral region: Secondary | ICD-10-CM | POA: Diagnosis not present

## 2013-01-28 DIAGNOSIS — M519 Unspecified thoracic, thoracolumbar and lumbosacral intervertebral disc disorder: Secondary | ICD-10-CM | POA: Diagnosis not present

## 2013-01-28 DIAGNOSIS — M545 Low back pain: Secondary | ICD-10-CM | POA: Diagnosis not present

## 2013-01-28 DIAGNOSIS — Z87891 Personal history of nicotine dependence: Secondary | ICD-10-CM | POA: Diagnosis not present

## 2013-01-28 MED ORDER — IBUPROFEN 800 MG PO TABS
800.0000 mg | ORAL_TABLET | Freq: Once | ORAL | Status: AC
  Administered 2013-01-28: 800 mg via ORAL
  Filled 2013-01-28: qty 1

## 2013-01-28 MED ORDER — POLYETHYLENE GLYCOL 3350 17 GM/SCOOP PO POWD: 17.0000 g | Freq: Every day | ORAL | Status: DC

## 2013-01-28 MED ORDER — HYDROCODONE-ACETAMINOPHEN 5-325 MG PO TABS: 1.0000 | ORAL_TABLET | ORAL | Status: DC | PRN
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
## 2013-01-28 NOTE — ED Provider Notes (Signed)
Medical screening examination/treatment/procedure(s) were performed by non-physician practitioner and as supervising physician I was immediately available for consultation/collaboration.  John-Adam Hunt Zajicek, M.D.   John-Adam Arieliz Latino, MD 01/28/13 0753 

## 2013-02-28 DIAGNOSIS — L293 Anogenital pruritus, unspecified: Secondary | ICD-10-CM | POA: Diagnosis not present

## 2013-02-28 DIAGNOSIS — L723 Sebaceous cyst: Secondary | ICD-10-CM | POA: Diagnosis not present

## 2013-03-02 DIAGNOSIS — E119 Type 2 diabetes mellitus without complications: Secondary | ICD-10-CM | POA: Diagnosis not present

## 2013-03-02 DIAGNOSIS — M1990 Unspecified osteoarthritis, unspecified site: Secondary | ICD-10-CM | POA: Diagnosis not present

## 2013-03-31 DIAGNOSIS — M545 Low back pain: Secondary | ICD-10-CM | POA: Diagnosis not present

## 2013-03-31 DIAGNOSIS — M5412 Radiculopathy, cervical region: Secondary | ICD-10-CM | POA: Diagnosis not present

## 2013-04-06 DIAGNOSIS — M542 Cervicalgia: Secondary | ICD-10-CM | POA: Diagnosis not present

## 2013-04-08 DIAGNOSIS — M5412 Radiculopathy, cervical region: Secondary | ICD-10-CM | POA: Diagnosis not present

## 2013-04-08 DIAGNOSIS — M542 Cervicalgia: Secondary | ICD-10-CM | POA: Diagnosis not present

## 2013-04-15 DIAGNOSIS — M542 Cervicalgia: Secondary | ICD-10-CM | POA: Diagnosis not present

## 2013-04-15 DIAGNOSIS — M5412 Radiculopathy, cervical region: Secondary | ICD-10-CM | POA: Diagnosis not present

## 2013-04-20 DIAGNOSIS — M5412 Radiculopathy, cervical region: Secondary | ICD-10-CM | POA: Diagnosis not present

## 2013-04-20 DIAGNOSIS — M542 Cervicalgia: Secondary | ICD-10-CM | POA: Diagnosis not present

## 2013-04-22 DIAGNOSIS — M542 Cervicalgia: Secondary | ICD-10-CM | POA: Diagnosis not present

## 2013-04-22 DIAGNOSIS — M5412 Radiculopathy, cervical region: Secondary | ICD-10-CM | POA: Diagnosis not present

## 2013-04-28 DIAGNOSIS — M542 Cervicalgia: Secondary | ICD-10-CM | POA: Diagnosis not present

## 2013-05-02 DIAGNOSIS — M542 Cervicalgia: Secondary | ICD-10-CM | POA: Diagnosis not present

## 2013-05-03 DIAGNOSIS — M545 Low back pain: Secondary | ICD-10-CM | POA: Diagnosis not present

## 2013-05-03 DIAGNOSIS — M542 Cervicalgia: Secondary | ICD-10-CM | POA: Diagnosis not present

## 2013-05-04 DIAGNOSIS — M542 Cervicalgia: Secondary | ICD-10-CM | POA: Diagnosis not present

## 2013-05-04 DIAGNOSIS — M5412 Radiculopathy, cervical region: Secondary | ICD-10-CM | POA: Diagnosis not present

## 2013-05-11 DIAGNOSIS — M5412 Radiculopathy, cervical region: Secondary | ICD-10-CM | POA: Diagnosis not present

## 2013-05-11 DIAGNOSIS — M545 Low back pain: Secondary | ICD-10-CM | POA: Diagnosis not present

## 2013-05-11 DIAGNOSIS — M542 Cervicalgia: Secondary | ICD-10-CM | POA: Diagnosis not present

## 2013-05-13 DIAGNOSIS — M545 Low back pain: Secondary | ICD-10-CM | POA: Diagnosis not present

## 2013-05-13 DIAGNOSIS — M542 Cervicalgia: Secondary | ICD-10-CM | POA: Diagnosis not present

## 2013-05-16 DIAGNOSIS — M545 Low back pain: Secondary | ICD-10-CM | POA: Diagnosis not present

## 2013-05-16 DIAGNOSIS — M542 Cervicalgia: Secondary | ICD-10-CM | POA: Diagnosis not present

## 2013-05-19 DIAGNOSIS — M542 Cervicalgia: Secondary | ICD-10-CM | POA: Diagnosis not present

## 2013-05-19 DIAGNOSIS — M545 Low back pain: Secondary | ICD-10-CM | POA: Diagnosis not present

## 2013-05-24 DIAGNOSIS — M545 Low back pain: Secondary | ICD-10-CM | POA: Diagnosis not present

## 2013-05-24 DIAGNOSIS — M542 Cervicalgia: Secondary | ICD-10-CM | POA: Diagnosis not present

## 2013-05-31 DIAGNOSIS — M545 Low back pain: Secondary | ICD-10-CM | POA: Diagnosis not present

## 2013-05-31 DIAGNOSIS — M542 Cervicalgia: Secondary | ICD-10-CM | POA: Diagnosis not present

## 2013-06-02 DIAGNOSIS — M545 Low back pain: Secondary | ICD-10-CM | POA: Diagnosis not present

## 2013-06-02 DIAGNOSIS — M542 Cervicalgia: Secondary | ICD-10-CM | POA: Diagnosis not present

## 2013-06-06 DIAGNOSIS — M542 Cervicalgia: Secondary | ICD-10-CM | POA: Diagnosis not present

## 2013-06-06 DIAGNOSIS — M545 Low back pain: Secondary | ICD-10-CM | POA: Diagnosis not present

## 2013-06-13 DIAGNOSIS — M542 Cervicalgia: Secondary | ICD-10-CM | POA: Diagnosis not present

## 2013-10-06 DIAGNOSIS — E559 Vitamin D deficiency, unspecified: Secondary | ICD-10-CM | POA: Diagnosis not present

## 2013-10-06 DIAGNOSIS — R5383 Other fatigue: Secondary | ICD-10-CM | POA: Diagnosis not present

## 2013-10-06 DIAGNOSIS — R5381 Other malaise: Secondary | ICD-10-CM | POA: Diagnosis not present

## 2013-10-06 DIAGNOSIS — R7309 Other abnormal glucose: Secondary | ICD-10-CM | POA: Diagnosis not present

## 2013-10-06 DIAGNOSIS — I10 Essential (primary) hypertension: Secondary | ICD-10-CM | POA: Diagnosis not present

## 2013-10-07 ENCOUNTER — Other Ambulatory Visit: Payer: Self-pay | Admitting: Nurse Practitioner

## 2013-10-07 ENCOUNTER — Other Ambulatory Visit: Payer: Self-pay

## 2013-10-07 DIAGNOSIS — Z1231 Encounter for screening mammogram for malignant neoplasm of breast: Secondary | ICD-10-CM

## 2013-10-17 DIAGNOSIS — H5200 Hypermetropia, unspecified eye: Secondary | ICD-10-CM | POA: Diagnosis not present

## 2013-10-17 DIAGNOSIS — H2589 Other age-related cataract: Secondary | ICD-10-CM | POA: Diagnosis not present

## 2013-10-17 DIAGNOSIS — H43819 Vitreous degeneration, unspecified eye: Secondary | ICD-10-CM | POA: Diagnosis not present

## 2013-10-24 ENCOUNTER — Ambulatory Visit
Admission: RE | Admit: 2013-10-24 | Discharge: 2013-10-24 | Disposition: A | Discharge status: Home / Self Care | Payer: Medicare Other | Source: Ambulatory Visit

## 2013-10-24 DIAGNOSIS — Z1231 Encounter for screening mammogram for malignant neoplasm of breast: Secondary | ICD-10-CM | POA: Diagnosis not present

## 2013-10-26 ENCOUNTER — Other Ambulatory Visit: Payer: Self-pay | Admitting: Family Medicine

## 2013-10-26 ENCOUNTER — Other Ambulatory Visit: Payer: Self-pay | Admitting: Internal Medicine

## 2013-10-26 ENCOUNTER — Other Ambulatory Visit: Payer: Self-pay | Admitting: Nurse Practitioner

## 2013-10-26 DIAGNOSIS — R928 Other abnormal and inconclusive findings on diagnostic imaging of breast: Secondary | ICD-10-CM

## 2013-11-04 ENCOUNTER — Ambulatory Visit
Admission: RE | Admit: 2013-11-04 | Discharge: 2013-11-04 | Disposition: A | Discharge status: Home / Self Care | Payer: Medicare Other | Source: Ambulatory Visit | Attending: Family Medicine | Admitting: Family Medicine

## 2013-11-04 ENCOUNTER — Other Ambulatory Visit: Payer: Self-pay | Admitting: Internal Medicine

## 2013-11-04 DIAGNOSIS — N63 Unspecified lump in unspecified breast: Secondary | ICD-10-CM | POA: Diagnosis not present

## 2013-11-04 DIAGNOSIS — R928 Other abnormal and inconclusive findings on diagnostic imaging of breast: Secondary | ICD-10-CM

## 2013-11-04 DIAGNOSIS — N6320 Unspecified lump in the left breast, unspecified quadrant: Secondary | ICD-10-CM

## 2013-11-09 ENCOUNTER — Other Ambulatory Visit: Payer: Medicare Other

## 2013-11-09 DIAGNOSIS — R0602 Shortness of breath: Secondary | ICD-10-CM | POA: Diagnosis not present

## 2013-11-09 DIAGNOSIS — I10 Essential (primary) hypertension: Secondary | ICD-10-CM | POA: Diagnosis not present

## 2013-11-09 DIAGNOSIS — Z0000 Encounter for general adult medical examination without abnormal findings: Secondary | ICD-10-CM | POA: Diagnosis not present

## 2013-11-09 DIAGNOSIS — Z87891 Personal history of nicotine dependence: Secondary | ICD-10-CM | POA: Diagnosis not present

## 2013-11-09 DIAGNOSIS — J45909 Unspecified asthma, uncomplicated: Secondary | ICD-10-CM | POA: Diagnosis not present

## 2013-11-10 ENCOUNTER — Ambulatory Visit
Admission: RE | Admit: 2013-11-10 | Discharge: 2013-11-10 | Disposition: A | Discharge status: Home / Self Care | Payer: Medicare Other | Source: Ambulatory Visit | Attending: Internal Medicine | Admitting: Internal Medicine

## 2013-11-10 DIAGNOSIS — N6320 Unspecified lump in the left breast, unspecified quadrant: Secondary | ICD-10-CM

## 2013-11-10 DIAGNOSIS — N6009 Solitary cyst of unspecified breast: Secondary | ICD-10-CM | POA: Diagnosis not present

## 2013-11-10 HISTORY — PX: BREAST CYST ASPIRATION: SHX578

## 2013-12-08 DIAGNOSIS — J45909 Unspecified asthma, uncomplicated: Secondary | ICD-10-CM | POA: Diagnosis not present

## 2013-12-08 DIAGNOSIS — R0602 Shortness of breath: Secondary | ICD-10-CM | POA: Diagnosis not present

## 2013-12-12 DIAGNOSIS — G4733 Obstructive sleep apnea (adult) (pediatric): Secondary | ICD-10-CM | POA: Diagnosis not present

## 2013-12-12 DIAGNOSIS — I10 Essential (primary) hypertension: Secondary | ICD-10-CM | POA: Diagnosis not present

## 2013-12-12 DIAGNOSIS — R0602 Shortness of breath: Secondary | ICD-10-CM | POA: Diagnosis not present

## 2014-01-02 ENCOUNTER — Other Ambulatory Visit: Payer: Self-pay

## 2014-01-02 DIAGNOSIS — E7800 Pure hypercholesterolemia, unspecified: Secondary | ICD-10-CM | POA: Insufficient documentation

## 2014-01-02 DIAGNOSIS — R0600 Dyspnea, unspecified: Secondary | ICD-10-CM | POA: Insufficient documentation

## 2014-01-02 DIAGNOSIS — J45909 Unspecified asthma, uncomplicated: Secondary | ICD-10-CM | POA: Insufficient documentation

## 2014-01-02 DIAGNOSIS — R5381 Other malaise: Secondary | ICD-10-CM | POA: Insufficient documentation

## 2014-01-02 DIAGNOSIS — R5383 Other fatigue: Secondary | ICD-10-CM

## 2014-01-02 MED ORDER — MELOXICAM 15 MG PO TABS
15.0000 mg | ORAL_TABLET | Freq: Every day | ORAL | Status: DC
Start: 2014-01-02 — End: 2014-01-03

## 2014-01-02 MED ORDER — ALBUTEROL SULFATE HFA 108 (90 BASE) MCG/ACT IN AERS: 2.0000 | INHALATION_SPRAY | Freq: Four times a day (QID) | RESPIRATORY_TRACT | Status: DC | PRN

## 2014-01-02 MED ORDER — METHOCARBAMOL 500 MG PO TABS: 1000.0000 mg | ORAL_TABLET | Freq: Every day | ORAL | Status: DC | PRN

## 2014-01-02 MED ORDER — MOMETASONE FURO-FORMOTEROL FUM 100-5 MCG/ACT IN AERO: 2.0000 | INHALATION_SPRAY | Freq: Two times a day (BID) | RESPIRATORY_TRACT | Status: DC

## 2014-01-03 ENCOUNTER — Encounter (INDEPENDENT_AMBULATORY_CARE_PROVIDER_SITE_OTHER): Payer: Self-pay

## 2014-01-03 ENCOUNTER — Encounter: Payer: Self-pay | Admitting: Internal Medicine

## 2014-01-03 ENCOUNTER — Other Ambulatory Visit: Payer: Medicare Other

## 2014-01-03 ENCOUNTER — Ambulatory Visit (INDEPENDENT_AMBULATORY_CARE_PROVIDER_SITE_OTHER): Payer: Medicare Other | Admitting: Internal Medicine

## 2014-01-03 VITALS — BP 116/62 | HR 65 | Ht 62.5 in | Wt 277.8 lb

## 2014-01-03 DIAGNOSIS — R0600 Dyspnea, unspecified: Secondary | ICD-10-CM

## 2014-01-03 DIAGNOSIS — R0989 Other specified symptoms and signs involving the circulatory and respiratory systems: Secondary | ICD-10-CM

## 2014-01-03 DIAGNOSIS — R0609 Other forms of dyspnea: Secondary | ICD-10-CM

## 2014-01-03 NOTE — Progress Notes (Signed)
Subjective:    Patient ID: Carrie Figueroa, female    DOB: 07-19-1946, 68 y.o.   MRN: [id number] PCP Cletis Athens ANN, NP  HPI  IOV 01/03/2014   Chief Complaint  Patient presents with  . Pulmonary Consult    Referred by Cletis Athens, NP for SOB.    68 year old morbidly obese African American female almost nonsmoker. In 2001 while living in [place] she was diagnosed with asthma based on pulmonary function test. She recollects being on Advair and Symbicort which helped. However after a while approximately a year or so she discontinued this treatment because she was feeling well. She then moved to [place], [place] in 2003 and has done fairly well although periodically she has resorted to using albuterol the details of which are completely unclear. In the last few to several weeks has noticed insidious onset of shortness of breath. This is associated with a 15 pound weight gain in the last 1 year. Dyspnea is associated with exertion such as doing household work and relieved by rest. There is no associated chest pain or hemoptysis but there is associated cough and wheezing at the extreme end of exertion when she feels dyspneic. She does have chronic venous stasis edema that she attributes to "fat ankle"and this is unchanged. She denies any DVT or PE risk factors recently such as travel or immobility. She is mostly concerned she has asthma  She is concerned about the cost of different testing. I have offered her that she could do these tests and then give a call so that I can follow the tests on the phone  Dyspnea relevant hx   reports that she has quit smoking. Her smoking use included Cigarettes. She has a .1 pack-year smoking history. She has never used smokeless tobacco.  Body mass index is 49.97 kg/(m^2).   Her father had phlebitis not otherwise specified of the lower extremity   Past Medical History  Diagnosis Date  . Hypertension   . Asthma   . Sleep apnea   .  CPAP (continuous positive airway pressure) dependence      Family History  Problem Relation Age of Onset  . Heart disease Mother   . Heart disease Father   . Diabetes Mother   . Diabetes Father   . Breast cancer Maternal Grandmother   . Breast cancer Sister      History   Social History  . Marital Status: Widowed    Spouse Name: N/A    Number of Children: N/A  . Years of Education: N/A   Occupational History  . retired    Social History Main Topics  . Smoking status: Former Smoker -- 0.10 packs/day for 1 years    Types: Cigarettes  . Smokeless tobacco: Never Used  . Alcohol Use: Yes     Comment: rarely   . Drug Use: No  . Sexual Activity: Not Currently   Other Topics Concern  . Not on file   Social History Narrative  . No narrative on file     No Known Allergies   Outpatient Prescriptions Prior to Visit  Medication Sig Dispense Refill  . aspirin EC 81 MG tablet Take 81 mg by mouth daily.      . methocarbamol (ROBAXIN) 500 MG tablet Take 2 tablets (1,000 mg total) by mouth daily as needed for muscle spasms.      . nebivolol (BYSTOLIC) 5 MG tablet Take 5 mg by mouth daily.      Marland Kitchen  Olmesartan-Amlodipine-HCTZ (TRIBENZOR) 40-10-25 MG TABS Take 1 tablet by mouth daily.      . meloxicam (MOBIC) 15 MG tablet Take 1 tablet (15 mg total) by mouth daily.      . mometasone-formoterol (DULERA) 100-5 MCG/ACT AERO Inhale 2 puffs into the lungs 2 (two) times daily.  1 Inhaler  0  . polyethylene glycol powder (GLYCOLAX/MIRALAX) powder Take 17 g by mouth daily.  255 g  0  . albuterol (PROAIR HFA) 108 (90 BASE) MCG/ACT inhaler Inhale 2 puffs into the lungs every 6 (six) hours as needed for wheezing or shortness of breath.  1 Inhaler  3  . HYDROcodone-acetaminophen (NORCO) 5-325 MG per tablet Take 1-2 tablets by mouth every 4 (four) hours as needed for pain.  20 tablet  0   No facility-administered medications prior to visit.       Review of Systems  Constitutional: Negative  for fever and unexpected weight change.  HENT: Positive for sinus pressure. Negative for congestion, dental problem, ear pain, nosebleeds, postnasal drip, rhinorrhea, sneezing, sore throat and trouble swallowing.   Eyes: Negative for redness and itching.  Respiratory: Positive for shortness of breath. Negative for cough, chest tightness and wheezing.   Cardiovascular: Positive for leg swelling. Negative for palpitations.  Gastrointestinal: Negative for nausea and vomiting.  Genitourinary: Negative for dysuria.  Musculoskeletal: Negative for joint swelling.  Skin: Negative for rash.  Neurological: Positive for headaches.  Hematological: Does not bruise/bleed easily.  Psychiatric/Behavioral: Negative for dysphoric mood. The patient is not nervous/anxious.        Objective:   Physical Exam  Vitals reviewed. Constitutional: She is oriented to person, place, and time. She appears well-developed and well-nourished. No distress.  HENT:  Head: Normocephalic and atraumatic.  Right Ear: External ear normal.  Left Ear: External ear normal.  Mouth/Throat: Oropharynx is clear and moist. No oropharyngeal exudate.  Eyes: Conjunctivae and EOM are normal. Pupils are equal, round, and reactive to light. Right eye exhibits no discharge. Left eye exhibits no discharge. No scleral icterus.  Neck: Normal range of motion. Neck supple. No JVD present. No tracheal deviation present. No thyromegaly present.  Cardiovascular: Normal rate, regular rhythm, normal heart sounds and intact distal pulses.  Exam reveals no gallop and no friction rub.   No murmur heard. Pulmonary/Chest: Effort normal and breath sounds normal. No respiratory distress. She has no wheezes. She has no rales. She exhibits no tenderness.  Abdominal: Soft. Bowel sounds are normal. She exhibits no distension and no mass. There is no tenderness. There is no rebound and no guarding.  Musculoskeletal: Normal range of motion. She exhibits no edema  and no tenderness.  Lymphadenopathy:    She has no cervical adenopathy.  Neurological: She is alert and oriented to person, place, and time. She has normal reflexes. No cranial nerve deficit. She exhibits normal muscle tone. Coordination normal.  Skin: Skin is warm and dry. No rash noted. She is not diaphoretic. No erythema. No pallor.  Psychiatric: She has a normal mood and affect. Her behavior is normal. Judgment and thought content normal.          Assessment & Plan:

## 2014-01-03 NOTE — Patient Instructions (Signed)
Plesae do d-dimer blood test for dyspnea Please do full PFT test for dyspnea  - after PFT test call us [phone] and leave message for my review and advice

## 2014-01-04 ENCOUNTER — Telehealth: Payer: Self-pay | Admitting: Internal Medicine

## 2014-01-04 LAB — D-DIMER, QUANTITATIVE (NOT AT ARMC): D DIMER QUANT: 0.48 ug{FEU}/mL (ref 0.00–0.48)

## 2014-01-04 NOTE — Telephone Encounter (Signed)
lmtcb X1 

## 2014-01-04 NOTE — Telephone Encounter (Signed)
Let her know d dimer normal. So no blood clot in lung to explain dyspnea. Let her know to [facility] sure she call us after 02/02/14 after PFT for review so I can call her about next step

## 2014-01-05 NOTE — Telephone Encounter (Signed)
Pt is aware of results. 

## 2014-01-08 NOTE — Assessment & Plan Note (Signed)
Likely multifactorial dyspnea with obesity, diastolic dysfunction, deconditioning as leading etiologies. Will need to rule out PE  PLAN Plesae do d-dimer blood test for dyspnea Please do full PFT test for dyspnea  - after PFT test call us [phone] and leave message for my review and advice

## 2014-01-09 DIAGNOSIS — I10 Essential (primary) hypertension: Secondary | ICD-10-CM | POA: Diagnosis not present

## 2014-01-09 DIAGNOSIS — R0602 Shortness of breath: Secondary | ICD-10-CM | POA: Diagnosis not present

## 2014-02-07 ENCOUNTER — Encounter (INDEPENDENT_AMBULATORY_CARE_PROVIDER_SITE_OTHER): Payer: Self-pay

## 2014-02-07 ENCOUNTER — Ambulatory Visit (INDEPENDENT_AMBULATORY_CARE_PROVIDER_SITE_OTHER): Payer: Medicare Other | Admitting: Internal Medicine

## 2014-02-07 DIAGNOSIS — R0600 Dyspnea, unspecified: Secondary | ICD-10-CM

## 2014-02-07 DIAGNOSIS — R0989 Other specified symptoms and signs involving the circulatory and respiratory systems: Secondary | ICD-10-CM

## 2014-02-07 DIAGNOSIS — R0609 Other forms of dyspnea: Secondary | ICD-10-CM

## 2014-02-07 LAB — PULMONARY FUNCTION TEST
DL/VA % pred: 145 %
DL/VA: 6.39 ml/min/mmHg/L
DLCO UNC % PRED: 92 %
DLCO UNC: 18.7 ml/min/mmHg
FEF 25-75 POST: 2.68 L/s
FEF 25-75 PRE: 2.1 L/s
FEF2575-%Change-Post: 27 %
FEF2575-%Pred-Post: 168 %
FEF2575-%Pred-Pre: 131 %
FEV1-%Change-Post: 3 %
FEV1-%Pred-Post: 93 %
FEV1-%Pred-Pre: 90 %
FEV1-POST: 1.54 L
FEV1-Pre: 1.49 L
FEV1FVC-%CHANGE-POST: 6 %
FEV1FVC-%Pred-Pre: 112 %
FEV6-%CHANGE-POST: -3 %
FEV6-%PRED-PRE: 83 %
FEV6-%Pred-Post: 80 %
FEV6-POST: 1.63 L
FEV6-PRE: 1.69 L
FEV6FVC-%PRED-POST: 104 %
FEV6FVC-%Pred-Pre: 104 %
FVC-%Change-Post: -3 %
FVC-%PRED-POST: 76 %
FVC-%PRED-PRE: 79 %
FVC-POST: 1.63 L
FVC-PRE: 1.69 L
Post FEV1/FVC ratio: 94 %
Post FEV6/FVC ratio: 100 %
Pre FEV1/FVC ratio: 88 %
Pre FEV6/FVC Ratio: 100 %
RV % PRED: 69 %
RV: 1.37 L
TLC % pred: 72 %
TLC: 3.35 L

## 2014-02-07 NOTE — Progress Notes (Signed)
PFT done today. 

## 2014-02-18 ENCOUNTER — Encounter (HOSPITAL_COMMUNITY): Payer: Self-pay | Admitting: Emergency Medicine

## 2014-02-18 ENCOUNTER — Emergency Department (HOSPITAL_COMMUNITY)
Admission: EM | Admit: 2014-02-18 | Discharge: 2014-02-18 | Disposition: A | Discharge status: Home / Self Care | Payer: Medicare Other | Attending: Emergency Medicine | Admitting: Emergency Medicine

## 2014-02-18 DIAGNOSIS — L732 Hidradenitis suppurativa: Secondary | ICD-10-CM | POA: Diagnosis not present

## 2014-02-18 DIAGNOSIS — Z87891 Personal history of nicotine dependence: Secondary | ICD-10-CM | POA: Insufficient documentation

## 2014-02-18 DIAGNOSIS — IMO0002 Reserved for concepts with insufficient information to code with codable children: Secondary | ICD-10-CM | POA: Insufficient documentation

## 2014-02-18 DIAGNOSIS — Z791 Long term (current) use of non-steroidal anti-inflammatories (NSAID): Secondary | ICD-10-CM | POA: Diagnosis not present

## 2014-02-18 DIAGNOSIS — Z7982 Long term (current) use of aspirin: Secondary | ICD-10-CM | POA: Diagnosis not present

## 2014-02-18 DIAGNOSIS — J45909 Unspecified asthma, uncomplicated: Secondary | ICD-10-CM | POA: Diagnosis not present

## 2014-02-18 DIAGNOSIS — Z9981 Dependence on supplemental oxygen: Secondary | ICD-10-CM | POA: Diagnosis not present

## 2014-02-18 DIAGNOSIS — I10 Essential (primary) hypertension: Secondary | ICD-10-CM | POA: Diagnosis not present

## 2014-02-18 DIAGNOSIS — G4730 Sleep apnea, unspecified: Secondary | ICD-10-CM | POA: Insufficient documentation

## 2014-02-18 MED ORDER — TRAMADOL HCL 50 MG PO TABS: 50.0000 mg | ORAL_TABLET | Freq: Four times a day (QID) | ORAL | Status: DC | PRN

## 2014-02-18 MED ORDER — CLINDAMYCIN HCL 150 MG PO CAPS: 300.0000 mg | ORAL_CAPSULE | Freq: Four times a day (QID) | ORAL | Status: DC

## 2014-02-18 NOTE — ED Provider Notes (Signed)
Medical screening examination/treatment/procedure(s) were performed by non-physician practitioner and as supervising physician I was immediately available for consultation/collaboration.  Richarda Blade, MD 02/18/14 1946

## 2014-02-18 NOTE — ED Notes (Signed)
Per pt sts she has been getting multiple boils over the past 2 weeks. sts she had some in groin area and in axilla area. sts she has 9 in axilla area. sts she doesn't feel well.

## 2014-02-18 NOTE — ED Provider Notes (Signed)
  Face-to-face evaluation   History: She complains of swelling and pain in the left axilla for 2 weeks. No similar in the past. She had a cyst below her vagina recently, but it resolved. She denies other pain or swelling in right axillae, groin or neck. No fever, chills, nausea, vomiting.  Physical exam: Obese, alert, calm, cooperative. Left axilla with indistinct diffuse swelling and multiple areas of localized nodules and tenderness. The swollen nodular areas are varying depths. Superficial nodules appear to be pointing somewhat, no active draining or bleeding.  Medical screening examination/treatment/procedure(s) were conducted as a shared visit with non-physician practitioner(s) and myself.  I personally evaluated the patient during the encounter  Richarda Blade, MD 02/20/14 1118

## 2014-02-18 NOTE — ED Notes (Signed)
MD at bedside. Lahoma Rocker PA

## 2014-02-18 NOTE — ED Notes (Signed)
Pt brought back to room via wheelchair; pt getting undressed and into a gown at this time; Ronny Bacon, NT aware

## 2014-02-18 NOTE — Discharge Instructions (Signed)
Warm compresses to the area. Keep clean. Do not shave. Take ibuprofen and ultram for pain. Clindamycin until all gone. Follow up with primary care doctor. May need a surgical referral. Return if worsening.    Hidradenitis Suppurativa, Sweat Gland Abscess Hidradenitis suppurativa is a long lasting (chronic), uncommon disease of the sweat glands. With this, boil-like lumps and scarring develop in the groin, some times under the arms (axillae), and under the breasts. It may also uncommonly occur behind the ears, in the crease of the buttocks, and around the genitals.  CAUSES  The cause is from a blocking of the sweat glands. They then become infected. It may cause drainage and odor. It is not contagious. So it cannot be given to someone else. It most often shows up in puberty (about 57 to 67 years of age). But it may happen much later. It is similar to acne which is a disease of the sweat glands. This condition is slightly more common in African-Americans and women. SYMPTOMS   Hidradenitis usually starts as one or more red, tender, swellings in the groin or under the arms (axilla).  Over a period of hours to days the lesions get larger. They often open to the skin surface, draining clear to yellow-colored fluid.  The infected area heals with scarring. DIAGNOSIS  Your caregiver makes this diagnosis by looking at you. Sometimes cultures (growing germs on plates in the lab) may be taken. This is to see what germ (bacterium) is causing the infection.  TREATMENT   Topical germ killing medicine applied to the skin (antibiotics) are the treatment of choice. Antibiotics taken by mouth (systemic) are sometimes needed when the condition is getting worse or is severe.  Avoid tight-fitting clothing which traps moisture in.  Dirt does not cause hidradenitis and it is not caused by poor hygiene.  Involved areas should be cleaned daily using an antibacterial soap. Some patients find that the liquid form of  Lever 2000, applied to the involved areas as a lotion after bathing, can help reduce the odor related to this condition.  Sometimes surgery is needed to drain infected areas or remove scarred tissue. Removal of large amounts of tissue is used only in severe cases.  Birth control pills may be helpful.  Oral retinoids (vitamin A derivatives) for 6 to 12 months which are effective for acne may also help this condition.  Weight loss will improve but not cure hidradenitis. It is made worse by being overweight. But the condition is not caused by being overweight.  This condition is more common in people who have had acne.  It may become worse under stress. There is no medical cure for hidradenitis. It can be controlled, but not cured. The condition usually continues for years with periods of getting worse and getting better (remission). Document Released: 04/22/2004 Document Revised: 12/01/2011 Document Reviewed: 05/08/2008 [facility] Patient Information 2014 [place].

## 2014-02-18 NOTE — ED Provider Notes (Signed)
CSN: [id number]     Arrival date & time 02/18/14  1127 History   First MD Initiated Contact with Patient 02/18/14 1143     Chief Complaint  Patient presents with  . Abscess     (Consider location/radiation/quality/duration/timing/severity/associated sxs/prior Treatment) HPI Carrie Figueroa is a 68 y.o. female  who presents to emergency department complaining of tender breakouts in her left groin and left axilla. She states approximately 2 weeks ago she had a breakout in the left groin, states they went away. States some drainage. She states that now she's having multiple small pimples noted. Tender in the left axilla, no drainage at this time. States they're very tender. She has not tried any treatment. She denies changing her arm. She denies any new products. She denies any history of the same. She denies any fever, chills, malaise. She states she read some information on a line and it worried her so she came to emergency department. She denies any breakouts in the groin at this time. She denies any breast pain or tenderness. She denies any other rash anywhere else.  Past Medical History  Diagnosis Date  . Hypertension   . Asthma   . Sleep apnea   . CPAP (continuous positive airway pressure) dependence    Past Surgical History  Procedure Laterality Date  . Bunionectomy    . Tubal plasty  1974   Family History  Problem Relation Age of Onset  . Heart disease Mother   . Heart disease Father   . Diabetes Mother   . Diabetes Father   . Breast cancer Maternal Grandmother   . Breast cancer Sister    History  Substance Use Topics  . Smoking status: Former Smoker -- 0.10 packs/day for 1 years    Types: Cigarettes  . Smokeless tobacco: Never Used  . Alcohol Use: Yes     Comment: rarely    OB History   Grav Para Term Preterm Abortions TAB SAB Ect Mult Living                 Review of Systems  Constitutional: Negative for fever and chills.  Respiratory: Negative for cough, chest  tightness and shortness of breath.   Cardiovascular: Negative for chest pain, palpitations and leg swelling.  Gastrointestinal: Negative for nausea, vomiting, abdominal pain and diarrhea.  Genitourinary: Negative for dysuria, flank pain and pelvic pain.  Musculoskeletal: Negative for arthralgias, myalgias, neck pain and neck stiffness.  Skin: Positive for rash and wound.  Neurological: Negative for dizziness, weakness and headaches.  All other systems reviewed and are negative.     Allergies  Review of patient's allergies indicates no known allergies.  Home Medications   Prior to Admission medications   Medication Sig Start Date End Date Taking? Authorizing Provider  albuterol (PROAIR HFA) 108 (90 BASE) MCG/ACT inhaler Inhale 2 puffs into the lungs every 6 (six) hours as needed for wheezing or shortness of breath. 01/02/14   Brand Males, MD  aspirin EC 81 MG tablet Take 81 mg by mouth daily.    Historical Provider, MD  meloxicam (MOBIC) 15 MG tablet Take 15 mg by mouth daily as needed. 01/02/14   Brand Males, MD  methocarbamol (ROBAXIN) 500 MG tablet Take 2 tablets (1,000 mg total) by mouth daily as needed for muscle spasms. 01/02/14   Brand Males, MD  mometasone-formoterol (DULERA) 200-5 MCG/ACT AERO Inhale 2 puffs into the lungs 2 (two) times daily.    Historical Provider, MD  nebivolol (BYSTOLIC) 5  MG tablet Take 5 mg by mouth daily.    Historical Provider, MD  Olmesartan-Amlodipine-HCTZ (TRIBENZOR) 40-10-25 MG TABS Take 1 tablet by mouth daily.    Historical Provider, MD   BP 185/95  Pulse 68  Temp(Src) 98.3 F (36.8 C) (Oral)  Resp 18  SpO2 100% Physical Exam  Nursing note and vitals reviewed. Constitutional: She appears well-developed and well-nourished. No distress.  HENT:  Head: Normocephalic.  Eyes: Conjunctivae are normal.  Neck: Neck supple.  Cardiovascular: Normal rate, regular rhythm and normal heart sounds.   Pulmonary/Chest: Effort normal and breath  sounds normal. No respiratory distress. She has no wheezes. She has no rales.  Abdominal: Soft. Bowel sounds are normal. She exhibits no distension. There is no tenderness. There is no rebound.  Musculoskeletal: She exhibits no edema.  Neurological: She is alert.  Skin: Skin is warm and dry.  Small, less than 1 cm papules that are erythematous some open, to the left axilla. Very tender. No surrounding cellulitis.  Psychiatric: She has a normal mood and affect. Her behavior is normal.    ED Course  Procedures (including critical care time) Labs Review Labs Reviewed - No data to display  Imaging Review No results found.   EKG Interpretation None      MDM   Final diagnoses:  Hidradenitis suppurativa of left axilla    Pt with left axilla follicultis vs hydradenitis supporativa. Discussed with Dr. Eulis Foster who has seen pt. Home with antibiotics, warm compresses, follow up. Will start on clindamycin. Ibuprofen and ultram for pain.   Filed Vitals:   02/18/14 1132 02/18/14 1159 02/18/14 1306  BP: 185/95  137/75  Pulse: 68  61  Temp: 98.3 F (36.8 C)    TempSrc: Oral    Resp: 18  20  SpO2: 97% 100% 100%     Renold Genta, PA-C 02/18/14 1805

## 2014-02-18 NOTE — ED Notes (Signed)
PT reports multiple small abscesses in her L armpit. Very tender to the touch.

## 2014-03-10 DIAGNOSIS — L732 Hidradenitis suppurativa: Secondary | ICD-10-CM | POA: Diagnosis not present

## 2014-04-18 ENCOUNTER — Other Ambulatory Visit: Payer: Self-pay | Admitting: Nurse Practitioner

## 2014-04-18 DIAGNOSIS — N6002 Solitary cyst of left breast: Secondary | ICD-10-CM

## 2014-04-18 DIAGNOSIS — N6320 Unspecified lump in the left breast, unspecified quadrant: Secondary | ICD-10-CM

## 2014-04-24 ENCOUNTER — Ambulatory Visit
Admission: RE | Admit: 2014-04-24 | Discharge: 2014-04-24 | Disposition: A | Discharge status: Home / Self Care | Payer: Medicare Other | Source: Ambulatory Visit | Attending: Nurse Practitioner | Admitting: Nurse Practitioner

## 2014-04-24 ENCOUNTER — Other Ambulatory Visit: Payer: Self-pay | Admitting: Nurse Practitioner

## 2014-04-24 DIAGNOSIS — N6320 Unspecified lump in the left breast, unspecified quadrant: Secondary | ICD-10-CM

## 2014-04-24 DIAGNOSIS — N6009 Solitary cyst of unspecified breast: Secondary | ICD-10-CM | POA: Diagnosis not present

## 2014-05-28 ENCOUNTER — Emergency Department (HOSPITAL_COMMUNITY)
Admission: EM | Admit: 2014-05-28 | Discharge: 2014-05-28 | Disposition: A | Discharge status: Home / Self Care | Payer: Medicare Other | Attending: Emergency Medicine | Admitting: Emergency Medicine

## 2014-05-28 ENCOUNTER — Emergency Department (HOSPITAL_COMMUNITY): Payer: Medicare Other

## 2014-05-28 ENCOUNTER — Encounter (HOSPITAL_COMMUNITY): Payer: Self-pay | Admitting: Emergency Medicine

## 2014-05-28 DIAGNOSIS — I10 Essential (primary) hypertension: Secondary | ICD-10-CM | POA: Insufficient documentation

## 2014-05-28 DIAGNOSIS — R0602 Shortness of breath: Secondary | ICD-10-CM | POA: Diagnosis not present

## 2014-05-28 DIAGNOSIS — J45901 Unspecified asthma with (acute) exacerbation: Secondary | ICD-10-CM | POA: Diagnosis not present

## 2014-05-28 DIAGNOSIS — Z7982 Long term (current) use of aspirin: Secondary | ICD-10-CM | POA: Diagnosis not present

## 2014-05-28 DIAGNOSIS — G4730 Sleep apnea, unspecified: Secondary | ICD-10-CM | POA: Diagnosis not present

## 2014-05-28 DIAGNOSIS — Z79899 Other long term (current) drug therapy: Secondary | ICD-10-CM | POA: Diagnosis not present

## 2014-05-28 DIAGNOSIS — Z87891 Personal history of nicotine dependence: Secondary | ICD-10-CM | POA: Insufficient documentation

## 2014-05-28 DIAGNOSIS — IMO0002 Reserved for concepts with insufficient information to code with codable children: Secondary | ICD-10-CM | POA: Insufficient documentation

## 2014-05-28 DIAGNOSIS — J45909 Unspecified asthma, uncomplicated: Secondary | ICD-10-CM | POA: Diagnosis not present

## 2014-05-28 LAB — BASIC METABOLIC PANEL
Anion gap: 13 (ref 5–15)
BUN: 21 mg/dL (ref 6–23)
CALCIUM: 9 mg/dL (ref 8.4–10.5)
CO2: 25 mEq/L (ref 19–32)
Chloride: 99 mEq/L (ref 96–112)
Creatinine, Ser: 0.89 mg/dL (ref 0.50–1.10)
GFR calc Af Amer: 76 mL/min — ABNORMAL LOW (ref >90)
GFR, EST NON AFRICAN AMERICAN: 66 mL/min — AB (ref >90)
Glucose, Bld: 116 mg/dL — ABNORMAL HIGH (ref 70–99)
Potassium: 4.1 mEq/L (ref 3.7–5.3)
Sodium: 137 mEq/L (ref 137–147)

## 2014-05-28 LAB — CBC
HEMATOCRIT: 39.7 % (ref 36.0–46.0)
Hemoglobin: 12.9 g/dL (ref 12.0–15.0)
MCH: 28.1 pg (ref 26.0–34.0)
MCHC: 32.5 g/dL (ref 30.0–36.0)
MCV: 86.5 fL (ref 78.0–100.0)
PLATELETS: 267 10*3/uL (ref 150–400)
RBC: 4.59 MIL/uL (ref 3.87–5.11)
RDW: 14.7 % (ref 11.5–15.5)
WBC: 8.4 10*3/uL (ref 4.0–10.5)

## 2014-05-28 LAB — PRO B NATRIURETIC PEPTIDE: Pro B Natriuretic peptide (BNP): 43.8 pg/mL (ref 0–125)

## 2014-05-28 MED ORDER — PREDNISONE 20 MG PO TABS
60.0000 mg | ORAL_TABLET | Freq: Once | ORAL | Status: AC
Start: 2014-05-28 — End: 2014-05-28
  Administered 2014-05-28: 60 mg via ORAL
  Filled 2014-05-28: qty 3

## 2014-05-28 MED ORDER — OPTICHAMBER ADVANTAGE MISC
1.0000 | Freq: Once | Status: AC
  Administered 2014-05-28: 1
  Filled 2014-05-28: qty 1

## 2014-05-28 MED ORDER — ALBUTEROL SULFATE (2.5 MG/3ML) 0.083% IN NEBU
5.0000 mg | INHALATION_SOLUTION | Freq: Once | RESPIRATORY_TRACT | Status: AC
  Administered 2014-05-28: 5 mg via RESPIRATORY_TRACT
  Filled 2014-05-28: qty 6

## 2014-05-28 MED ORDER — IPRATROPIUM BROMIDE 0.02 % IN SOLN
0.5000 mg | Freq: Once | RESPIRATORY_TRACT | Status: AC
  Administered 2014-05-28: 0.5 mg via RESPIRATORY_TRACT
  Filled 2014-05-28: qty 2.5

## 2014-05-28 MED ORDER — PREDNISONE 20 MG PO TABS: 40.0000 mg | ORAL_TABLET | Freq: Every day | ORAL | Status: DC

## 2014-05-28 NOTE — ED Provider Notes (Signed)
CSN: [id number]     Arrival date & time 05/28/14  1923 History   First MD Initiated Contact with Patient 05/28/14 2010     Chief Complaint  Patient presents with  . Shortness of Breath     (Consider location/radiation/quality/duration/timing/severity/associated sxs/prior Treatment) Patient is a 68 y.o. female presenting with shortness of breath. The history is provided by the patient and medical records. No language interpreter was used.  Shortness of Breath Associated symptoms: cough and wheezing   Associated symptoms: no abdominal pain, no chest pain, no diaphoresis, no fever, no headaches, no rash and no vomiting     Carrie Figueroa is a 68 y.o. female  with a hx of retention, asthma, sleep apnea with CPAP usage presents to the Emergency Department complaining of gradual, persistent, progressively worsening shortness of breath over the last several weeks but worsening this evening. Patient reports that her shortness of breath is worse at night and better during the day. She denies cardiac history, peripheral edema, recent travel, estrogen use, recent surgery or history of DVT/PE.  Patient reports that she recently saw a pulmonologist who assessed her for asthma but does not know the results. She reports she has an albuterol inhaler which she has been using several times per day. She reports this does help for short time but then her shortness of breath improves.   She reports associated coughing and wheezing. She denies dyspnea on exertion. Patient also endorses production of sputum yesterday but does not know the color.  Pt denies fever, chills, headache, neck pain, chest pain, abdominal pain, nausea, vomiting, diarrhea, weakness, dizziness, syncope, dysuria. Patient has no personal history of diabetes..     Past Medical History  Diagnosis Date  . Hypertension   . Asthma   . Sleep apnea   . CPAP (continuous positive airway pressure) dependence    Past Surgical History  Procedure  Laterality Date  . Bunionectomy    . Tubal plasty  1974   Family History  Problem Relation Age of Onset  . Heart disease Mother   . Heart disease Father   . Diabetes Mother   . Diabetes Father   . Breast cancer Maternal Grandmother   . Breast cancer Sister    History  Substance Use Topics  . Smoking status: Former Smoker -- 0.10 packs/day for 1 years    Types: Cigarettes  . Smokeless tobacco: Never Used  . Alcohol Use: Yes     Comment: rarely    OB History   Grav Para Term Preterm Abortions TAB SAB Ect Mult Living                 Review of Systems  Constitutional: Negative for fever, diaphoresis, appetite change, fatigue and unexpected weight change.  HENT: Negative for mouth sores.   Eyes: Negative for visual disturbance.  Respiratory: Positive for cough, shortness of breath and wheezing. Negative for chest tightness.   Cardiovascular: Negative for chest pain.  Gastrointestinal: Negative for nausea, vomiting, abdominal pain, diarrhea and constipation.  Endocrine: Negative for polydipsia, polyphagia and polyuria.  Genitourinary: Negative for dysuria, urgency, frequency and hematuria.  Musculoskeletal: Negative for back pain and neck stiffness.  Skin: Negative for rash.  Allergic/Immunologic: Negative for immunocompromised state.  Neurological: Negative for syncope, light-headedness and headaches.  Hematological: Does not bruise/bleed easily.  Psychiatric/Behavioral: Negative for sleep disturbance. The patient is not nervous/anxious.       Allergies  Review of patient's allergies indicates no known allergies.  Home Medications  Prior to Admission medications   Medication Sig Start Date End Date Taking? Authorizing Provider  albuterol (PROAIR HFA) 108 (90 BASE) MCG/ACT inhaler Inhale 2 puffs into the lungs every 6 (six) hours as needed for wheezing or shortness of breath. 01/02/14  Yes Brand Males, MD  aspirin EC 81 MG tablet Take 81 mg by mouth daily.   Yes  Historical Provider, MD  meloxicam (MOBIC) 15 MG tablet Take 15 mg by mouth daily as needed. 01/02/14  Yes Brand Males, MD  methocarbamol (ROBAXIN) 500 MG tablet Take 2 tablets (1,000 mg total) by mouth daily as needed for muscle spasms. 01/02/14  Yes Brand Males, MD  Multiple Vitamin (MULTIVITAMIN WITH MINERALS) TABS tablet Take 2 tablets by mouth daily.   Yes Historical Provider, MD  nebivolol (BYSTOLIC) 5 MG tablet Take 5 mg by mouth daily.   Yes Historical Provider, MD  Olmesartan-Amlodipine-HCTZ (TRIBENZOR) 40-10-25 MG TABS Take 1 tablet by mouth daily.   Yes Historical Provider, MD  predniSONE (DELTASONE) 20 MG tablet Take 2 tablets (40 mg total) by mouth daily. 05/28/14   Alisi Lupien, PA-C   BP 151/73  Pulse 81  Temp(Src) 98 F (36.7 C) (Oral)  Resp 18  Ht 5\' 2"  (1.575 m)  Wt 276 lb (125.193 kg)  BMI 50.47 kg/m2  SpO2 100% Physical Exam  Nursing note and vitals reviewed. Constitutional: She is oriented to person, place, and time. She appears well-developed and well-nourished. No distress.  Awake, alert, nontoxic appearance  HENT:  Head: Normocephalic and atraumatic.  Right Ear: Tympanic membrane, external ear and ear canal normal.  Left Ear: Tympanic membrane, external ear and ear canal normal.  Nose: Nose normal. No epistaxis. Right sinus exhibits no maxillary sinus tenderness and no frontal sinus tenderness. Left sinus exhibits no maxillary sinus tenderness and no frontal sinus tenderness.  Mouth/Throat: Uvula is midline, oropharynx is clear and moist and mucous membranes are normal. Mucous membranes are not pale and not cyanotic. No oropharyngeal exudate, posterior oropharyngeal edema, posterior oropharyngeal erythema or tonsillar abscesses.  Eyes: Conjunctivae are normal. Pupils are equal, round, and reactive to light. No scleral icterus.  Neck: Normal range of motion and full passive range of motion without pain. Neck supple.  Cardiovascular: Normal rate,  regular rhythm, normal heart sounds and intact distal pulses.   No murmur heard. Pulmonary/Chest: Effort normal. No accessory muscle usage or stridor. Not tachypneic. No respiratory distress. She has decreased breath sounds (throughout). She has no wheezes. She has no rhonchi. She has no rales.  Equal chest expansion Diminished breath sounds throughout without focal rhonchi, wheezes or rales  Abdominal: Soft. Bowel sounds are normal. She exhibits no mass. There is no tenderness. There is no rebound and no guarding.  Musculoskeletal: Normal range of motion. She exhibits no edema.  No peripheral edema  Lymphadenopathy:    She has no cervical adenopathy.  Neurological: She is alert and oriented to person, place, and time. She exhibits normal muscle tone. Coordination normal.  Speech is clear and goal oriented Moves extremities without ataxia  Skin: Skin is warm and dry. No rash noted. She is not diaphoretic. No erythema.  Psychiatric: She has a normal mood and affect.    ED Course  Procedures (including critical care time) Labs Review Labs Reviewed  BASIC METABOLIC PANEL - Abnormal; Notable for the following:    Glucose, Bld 116 (*)    GFR calc non Af Amer 66 (*)    GFR calc Af Amer 76 (*)  All other components within normal limits  CBC  PRO B NATRIURETIC PEPTIDE    Imaging Review Dg Chest 2 View  05/28/2014   CLINICAL DATA:  Shortness of breath.  Asthma.  Hypertension.  EXAM: CHEST  2 VIEW  COMPARISON:  07/12/2009  FINDINGS: Mild cardiomegaly and ectasia of the thoracic aorta are stable. Bilateral central peribronchial thickening and mild bibasilar scarring is also unchanged. No evidence of acute infiltrate or pulmonary edema. No evidence of pleural effusion.  IMPRESSION: Stable exam.  No active disease.   Electronically Signed   By: Earle Gell M.D.   On: 05/28/2014 21:42     EKG Interpretation   Date/Time:  Sunday May 28 2014 20:51:40 EDT Ventricular Rate:  63 PR  Interval:  187 QRS Duration: 82 QT Interval:  401 QTC Calculation: 410 R Axis:   47 Text Interpretation:  Normal sinus rhythm Normal ECG Confirmed by RAY MD,  Andee Poles ([id number]) on 05/28/2014 11:33:47 PM        MDM   Final diagnoses:  Asthma exacerbation   Carrie Figueroa presents with shortness of breath the last several weeks, worsening today. Review shows that she saw her pulmonologist in May of 2015 for a pulmonary function test. She reports she never returned for followup to obtain the results bolus given an albuterol inhaler which she has been using at home.  Likely asthma exacerbation today. No peripheral edema on exam, no Rales or cardiac murmur. Basic labs, chest x-ray and EKG pending.  11:31 PM Patient with resolution of shortness of breath. Clear and equal breath sounds on reassessment.  CBC without leukocytosis, chest x-ray without evidence of pneumonia, EKG nonischemic and BNP normal. No peripheral edema or rales on exam to suggest CHF.  Patient ambulated in ED with O2 saturations maintained >90, no current signs of respiratory distress. Lung exam improved after nebulizer treatment. Prednisone given in the ED and pt will be dc with 5 day burst. Pt states they are breathing at baseline. Pt has been instructed to continue using prescribed medications and to speak with PCP and pulmonologist within one week about today's exacerbation. Patient is to return to the emergency department for increasing shortness of breath, chest pain or other concerning symptoms.   BP 151/73  Pulse 81  Temp(Src) 98 F (36.7 C) (Oral)  Resp 18  Ht 5\' 2"  (1.575 m)  Wt 276 lb (125.193 kg)  BMI 50.47 kg/m2  SpO2 100%   The patient was discussed with and seen by Dr. Jeanell Sparrow who agrees with the treatment plan.   Jarrett Soho Dajuan Turnley, PA-C 05/28/14 2334

## 2014-05-28 NOTE — ED Provider Notes (Signed)
68 y.o. Female with some dyspnea and wheezing with history of asthma. Lungs cta here after treatment  I performed a history and physical examination of Carrie Figueroa and discussed her management with Ms. [place].  I agree with the history, physical, assessment, and plan of care, with the following exceptions: None  I was present for the following procedures: None Time Spent in Critical Care of the patient: None Time spent in discussions with the patient and family: [place], MD 05/28/14 2340

## 2014-05-28 NOTE — ED Notes (Signed)
Pt arrived to the ED with a complaint of shortness of breath.  Pt has been seeing a pulmonologist but has not heard of any results.  Pt states she has constant wheezing and coughing.  Pt has an inhaler and has used it continually.  Pt states inside environments are more irritating than inside

## 2014-05-28 NOTE — ED Notes (Signed)
Informed RN and Tech for recollect on [employer] tubes for lab.

## 2014-05-28 NOTE — Discharge Instructions (Signed)
1. Medications: albuterol inhaler with spacer, prednisone, usual home medications 2. Treatment: rest, drink plenty of fluids, begin taking Zyrtec over the counter medications 3. Follow Up: Please followup with your primary doctor in 3 days and your pulmonologist in the next week for discussion of your diagnoses and further evaluation after today's visit;   Asthma Attack Prevention Although there is no way to prevent asthma from starting, you can take steps to control the disease and reduce its symptoms. Learn about your asthma and how to control it. Take an active role to control your asthma by working with your health care provider to create and follow an asthma action plan. An asthma action plan guides you in:  Taking your medicines properly.  Avoiding things that set off your asthma or make your asthma worse (asthma triggers).  Tracking your level of asthma control.  Responding to worsening asthma.  Seeking emergency care when needed. To track your asthma, keep records of your symptoms, check your peak flow number using a handheld device that shows how well air moves out of your lungs (peak flow meter), and get regular asthma checkups.  WHAT ARE SOME WAYS TO PREVENT AN ASTHMA ATTACK?  Take medicines as directed by your health care provider.  Keep track of your asthma symptoms and level of control.  With your health care provider, write a detailed plan for taking medicines and managing an asthma attack. Then be sure to follow your action plan. Asthma is an ongoing condition that needs regular monitoring and treatment.  Identify and avoid asthma triggers. Many outdoor allergens and irritants (such as pollen, mold, cold air, and air pollution) can trigger asthma attacks. Find out what your asthma triggers are and take steps to avoid them.  Monitor your breathing. Learn to recognize warning signs of an attack, such as coughing, wheezing, or shortness of breath. Your lung function may  decrease before you notice any signs or symptoms, so regularly measure and record your peak airflow with a home peak flow meter.  Identify and treat attacks early. If you act quickly, you are less likely to have a severe attack. You will also need less medicine to control your symptoms. When your peak flow measurements decrease and alert you to an upcoming attack, take your medicine as instructed and immediately stop any activity that may have triggered the attack. If your symptoms do not improve, get medical help.  Pay attention to increasing quick-relief inhaler use. If you find yourself relying on your quick-relief inhaler, your asthma is not under control. See your health care provider about adjusting your treatment. WHAT CAN MAKE MY SYMPTOMS WORSE? A number of common things can set off or make your asthma symptoms worse and cause temporary increased inflammation of your airways. Keep track of your asthma symptoms for several weeks, detailing all the environmental and emotional factors that are linked with your asthma. When you have an asthma attack, go back to your asthma diary to see which factor, or combination of factors, might have contributed to it. Once you know what these factors are, you can take steps to control many of them. If you have allergies and asthma, it is important to take asthma prevention steps at home. Minimizing contact with the substance to which you are allergic will help prevent an asthma attack. Some triggers and ways to avoid these triggers are: Animal Dander:  Some people are allergic to the flakes of skin or dried saliva from animals with fur or feathers.  There is no such thing as a hypoallergenic dog or cat breed. All dogs or cats can cause allergies, even if they don't shed.  Keep these pets out of your home.  If you are not able to keep a pet outdoors, keep the pet out of your bedroom and other sleeping areas at all times, and keep the door closed.  Remove  carpets and furniture covered with cloth from your home. If that is not possible, keep the pet away from fabric-covered furniture and carpets. Dust Mites: Many people with asthma are allergic to dust mites. Dust mites are tiny bugs that are found in every home in mattresses, pillows, carpets, fabric-covered furniture, bedcovers, clothes, stuffed toys, and other fabric-covered items.   Cover your mattress in a special dust-proof cover.  Cover your pillow in a special dust-proof cover, or wash the pillow each week in hot water. Water must be hotter than 130 F (54.4 C) to kill dust mites. Cold or warm water used with detergent and bleach can also be effective.  Wash the sheets and blankets on your bed each week in hot water.  Try not to sleep or lie on cloth-covered cushions.  Call ahead when traveling and ask for a smoke-free hotel room. Bring your own bedding and pillows in case the hotel only supplies feather pillows and down comforters, which may contain dust mites and cause asthma symptoms.  Remove carpets from your bedroom and those laid on concrete, if you can.  Keep stuffed toys out of the bed, or wash the toys weekly in hot water or cooler water with detergent and bleach. Cockroaches: Many people with asthma are allergic to the droppings and remains of cockroaches.   Keep food and garbage in closed containers. Never leave food out.  Use poison baits, traps, powders, gels, or paste (for example, boric acid).  If a spray is used to kill cockroaches, stay out of the room until the odor goes away. Indoor Mold:  Fix leaky faucets, pipes, or other sources of water that have mold around them.  Clean floors and moldy surfaces with a fungicide or diluted bleach.  Avoid using humidifiers, vaporizers, or swamp coolers. These can spread molds through the air. Pollen and Outdoor Mold:  When pollen or mold spore counts are high, try to keep your windows closed.  Stay indoors with  windows closed from late morning to afternoon. Pollen and some mold spore counts are highest at that time.  Ask your health care provider whether you need to take anti-inflammatory medicine or increase your dose of the medicine before your allergy season starts. Other Irritants to Avoid:  Tobacco smoke is an irritant. If you smoke, ask your health care provider how you can quit. Ask family members to quit smoking, too. Do not allow smoking in your home or car.  If possible, do not use a wood-burning stove, kerosene heater, or fireplace. Minimize exposure to all sources of smoke, including incense, candles, fires, and fireworks.  Try to stay away from strong odors and sprays, such as perfume, talcum powder, hair spray, and paints.  Decrease humidity in your home and use an indoor air cleaning device. Reduce indoor humidity to below 60%. Dehumidifiers or central air conditioners can do this.  Decrease house dust exposure by changing furnace and air cooler filters frequently.  Try to have someone else vacuum for you once or twice a week. Stay out of rooms while they are being vacuumed and for a short while afterward.  If you vacuum, use a dust mask from a hardware store, a double-layered or microfilter vacuum cleaner bag, or a vacuum cleaner with a HEPA filter.  Sulfites in foods and beverages can be irritants. Do not drink beer or wine or eat dried fruit, processed potatoes, or shrimp if they cause asthma symptoms.  Cold air can trigger an asthma attack. Cover your nose and mouth with a scarf on cold or windy days.  Several health conditions can make asthma more difficult to manage, including a runny nose, sinus infections, reflux disease, psychological stress, and sleep apnea. Work with your health care provider to manage these conditions.  Avoid close contact with people who have a respiratory infection such as a cold or the flu, since your asthma symptoms may get worse if you catch the  infection. Wash your hands thoroughly after touching items that may have been handled by people with a respiratory infection.  Get a flu shot every year to protect against the flu virus, which often makes asthma worse for days or weeks. Also get a pneumonia shot if you have not previously had one. Unlike the flu shot, the pneumonia shot does not need to be given yearly. Medicines:  Talk to your health care provider about whether it is safe for you to take aspirin or non-steroidal anti-inflammatory medicines (NSAIDs). In a small number of people with asthma, aspirin and NSAIDs can cause asthma attacks. These medicines must be avoided by people who have known aspirin-sensitive asthma. It is important that people with aspirin-sensitive asthma read labels of all over-the-counter medicines used to treat pain, colds, coughs, and fever.  Beta-blockers and ACE inhibitors are other medicines you should discuss with your health care provider. HOW CAN I FIND OUT WHAT I AM ALLERGIC TO? Ask your asthma health care provider about allergy skin testing or blood testing (the RAST test) to identify the allergens to which you are sensitive. If you are found to have allergies, the most important thing to do is to try to avoid exposure to any allergens that you are sensitive to as much as possible. Other treatments for allergies, such as medicines and allergy shots (immunotherapy) are available.  CAN I EXERCISE? Follow your health care provider's advice regarding asthma treatment before exercising. It is important to maintain a regular exercise program, but vigorous exercise or exercise in cold, humid, or dry environments can cause asthma attacks, especially for those people who have exercise-induced asthma. Document Released: 08/27/2009 Document Revised: 09/13/2013 Document Reviewed: 03/16/2013 [facility] Patient Information 2015 [place], [place]. This information is not intended to replace advice given to you by your health  care provider. Make sure you discuss any questions you have with your health care provider.

## 2014-06-12 ENCOUNTER — Encounter: Payer: Self-pay | Admitting: Internal Medicine

## 2014-06-12 ENCOUNTER — Ambulatory Visit (INDEPENDENT_AMBULATORY_CARE_PROVIDER_SITE_OTHER): Payer: Medicare Other | Admitting: Internal Medicine

## 2014-06-12 VITALS — BP 116/80 | HR 74 | Ht 62.0 in | Wt 273.0 lb

## 2014-06-12 DIAGNOSIS — R0609 Other forms of dyspnea: Secondary | ICD-10-CM | POA: Diagnosis not present

## 2014-06-12 DIAGNOSIS — R0989 Other specified symptoms and signs involving the circulatory and respiratory systems: Secondary | ICD-10-CM

## 2014-06-12 DIAGNOSIS — R0600 Dyspnea, unspecified: Secondary | ICD-10-CM

## 2014-06-12 NOTE — Progress Notes (Signed)
Subjective:    Patient ID: Carrie Figueroa, female    DOB: 11-Jul-1946, 68 y.o.   MRN: [id number]  HPI  IOV 06/12/2014  Chief Complaint  Patient presents with  . Follow-up    Pt went to [facility] ED on 05/28/2014 for asthma exacerbation. Pt states her breathing has improved. Pt denies SOB, cough and CP/tightness.      IOV 01/03/2014   Chief Complaint  Patient presents with  . Pulmonary Consult    Referred by Cletis Athens, NP for SOB.    68 year old morbidly obese African American female almost nonsmoker. In 2001 while living in [place] she was diagnosed with asthma based on pulmonary function test. She recollects being on Advair and Symbicort which helped. However after a while approximately a year or so she discontinued this treatment because she was feeling well. She then moved to [place], [place] in 2003 and has done fairly well although periodically she has resorted to using albuterol the details of which are completely unclear. In the last few to several weeks has noticed insidious onset of shortness of breath. This is associated with a 15 pound weight gain in the last 1 year. Dyspnea is associated with exertion such as doing household work and relieved by rest. There is no associated chest pain or hemoptysis but there is associated cough and wheezing at the extreme end of exertion when she feels dyspneic. She does have chronic venous stasis edema that she attributes to "fat ankle"and this is unchanged. She denies any DVT or PE risk factors recently such as travel or immobility. 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 I have offered her that she could do these tests and then give a call so that I can follow the tests on the phone  Dyspnea relevant hx   reports that she has quit smoking. Her smoking use included Cigarettes. She has a .1 pack-year smoking history.  She has never used smokeless tobacco.  Body mass index is 49.97 kg/(m^2).   Her father had phlebitis not otherwise specified of the lower extremity   OV 06/12/2014  Chief Complaint  Patient presents with  . Follow-up    Pt went to [facility] ED on 05/28/2014 for asthma exacerbation. Pt states her breathing has improved. Pt denies SOB, cough and CP/tightness.     Dyspnea follwuup  She did not followup. Dd-dimer was normal. PFT May 2015: was normal but she never called back. Dyspnea persists. Then in 05/28/14 went into ER with one week hx of worsening wheeze. Exam there did not show wheeze. CXr clear. Given 5d pred and now she back to baseline dyspnea which is bothersome. She wants to contnue with workup. She recolllects that in 2000 her spirometry was abnormal; not sure about details.     Review of Systems  Constitutional: Negative for fever and unexpected weight change.  HENT: Negative for congestion, dental problem, ear pain, nosebleeds, postnasal drip, rhinorrhea, sinus pressure, sneezing, sore throat and trouble swallowing.   Eyes: Negative for redness and itching.  Respiratory: Negative for cough, chest tightness, shortness of breath and wheezing.   Cardiovascular: Negative for palpitations and leg swelling.  Gastrointestinal: Negative for nausea and vomiting.  Genitourinary: Negative for dysuria.  Musculoskeletal: Negative for joint swelling.  Skin: Negative for rash.  Neurological: Negative for headaches.  Hematological: Does not bruise/bleed easily.  Psychiatric/Behavioral:  Negative for dysphoric mood. The patient is not nervous/anxious.    Current outpatient prescriptions:albuterol (PROAIR HFA) 108 (90 BASE) MCG/ACT inhaler, Inhale 2 puffs into the lungs every 6 (six) hours as needed for wheezing or shortness of breath., Disp: 1 Inhaler, Rfl: 3;  aspirin EC 81 MG tablet, Take 81 mg by mouth daily., Disp: , Rfl: ;  cetirizine (ZYRTEC) 10 MG tablet, Take 10 mg by mouth daily., Disp: ,  Rfl: ;  meloxicam (MOBIC) 15 MG tablet, Take 15 mg by mouth daily as needed., Disp: , Rfl:  methocarbamol (ROBAXIN) 500 MG tablet, Take 2 tablets (1,000 mg total) by mouth daily as needed for muscle spasms., Disp: , Rfl: ;  Multiple Vitamin (MULTIVITAMIN WITH MINERALS) TABS tablet, Take 2 tablets by mouth daily., Disp: , Rfl: ;  nebivolol (BYSTOLIC) 5 MG tablet, Take 5 mg by mouth daily., Disp: , Rfl: ;  Olmesartan-Amlodipine-HCTZ (TRIBENZOR) 40-10-25 MG TABS, Take 1 tablet by mouth daily., Disp: , Rfl:      Objective:   Physical Exam  Filed Vitals:   06/12/14 1534  BP: 116/80  Pulse: 74  Height: 5\' 2"  (1.575 m)  Weight: 273 lb (123.832 kg)  SpO2: 96%  Body mass index is 49.92 kg/(m^2).        Assessment & Plan:  #Shortness of breath  - unclear cause; possible asthma  - start advair 100/50 1 puff per day scheduled. Do not miss dose - use albuterol as needed -re turn in 6 weeks; if no improvement will consider CPST bike test  #Followup  6 weeks

## 2014-06-12 NOTE — Patient Instructions (Addendum)
#  Shortness of breath  - unclear cause; possible asthma  - start advair 100/50 1 puff per day scheduled. Do not miss dose - use albuterol as needed -re turn in 6 weeks; if no improvement will consider CPST bike test  #Followup  6 weeks

## 2014-06-13 MED ORDER — FLUTICASONE-SALMETEROL 100-50 MCG/DOSE IN AEPB: 1.0000 | INHALATION_SPRAY | Freq: Two times a day (BID) | RESPIRATORY_TRACT | Status: DC

## 2014-06-13 NOTE — Addendum Note (Signed)
Addended by: Maurice March on: 06/13/2014 11:03 AM   Modules accepted: Orders

## 2014-07-07 ENCOUNTER — Other Ambulatory Visit: Payer: Self-pay

## 2014-07-07 ENCOUNTER — Other Ambulatory Visit: Payer: Self-pay | Admitting: Nurse Practitioner

## 2014-07-07 ENCOUNTER — Ambulatory Visit
Admission: RE | Admit: 2014-07-07 | Discharge: 2014-07-07 | Disposition: A | Discharge status: Home / Self Care | Payer: Medicare Other | Source: Ambulatory Visit | Attending: Nurse Practitioner | Admitting: Nurse Practitioner

## 2014-07-07 DIAGNOSIS — Z23 Encounter for immunization: Secondary | ICD-10-CM | POA: Diagnosis not present

## 2014-07-07 DIAGNOSIS — I10 Essential (primary) hypertension: Secondary | ICD-10-CM | POA: Diagnosis not present

## 2014-07-07 DIAGNOSIS — M5134 Other intervertebral disc degeneration, thoracic region: Secondary | ICD-10-CM | POA: Diagnosis not present

## 2014-07-07 DIAGNOSIS — R0781 Pleurodynia: Secondary | ICD-10-CM | POA: Diagnosis not present

## 2014-07-07 DIAGNOSIS — R0782 Intercostal pain: Secondary | ICD-10-CM | POA: Diagnosis not present

## 2014-07-07 DIAGNOSIS — R52 Pain, unspecified: Secondary | ICD-10-CM

## 2014-07-07 DIAGNOSIS — J45909 Unspecified asthma, uncomplicated: Secondary | ICD-10-CM | POA: Diagnosis not present

## 2014-07-20 DIAGNOSIS — Z124 Encounter for screening for malignant neoplasm of cervix: Secondary | ICD-10-CM | POA: Diagnosis not present

## 2014-07-20 DIAGNOSIS — Z01419 Encounter for gynecological examination (general) (routine) without abnormal findings: Secondary | ICD-10-CM | POA: Diagnosis not present

## 2014-08-02 ENCOUNTER — Ambulatory Visit: Payer: Medicare Other | Admitting: Internal Medicine

## 2014-08-02 DIAGNOSIS — M1711 Unilateral primary osteoarthritis, right knee: Secondary | ICD-10-CM | POA: Diagnosis not present

## 2014-08-02 DIAGNOSIS — M1712 Unilateral primary osteoarthritis, left knee: Secondary | ICD-10-CM | POA: Diagnosis not present

## 2014-08-02 DIAGNOSIS — M545 Low back pain: Secondary | ICD-10-CM | POA: Diagnosis not present

## 2014-08-07 DIAGNOSIS — M545 Low back pain: Secondary | ICD-10-CM | POA: Diagnosis not present

## 2014-08-10 DIAGNOSIS — M545 Low back pain: Secondary | ICD-10-CM | POA: Diagnosis not present

## 2014-08-24 DIAGNOSIS — M545 Low back pain: Secondary | ICD-10-CM | POA: Diagnosis not present

## 2014-09-05 DIAGNOSIS — M545 Low back pain: Secondary | ICD-10-CM | POA: Diagnosis not present

## 2014-09-14 DIAGNOSIS — M545 Low back pain: Secondary | ICD-10-CM | POA: Diagnosis not present

## 2014-09-29 DIAGNOSIS — M25512 Pain in left shoulder: Secondary | ICD-10-CM | POA: Diagnosis not present

## 2014-09-29 DIAGNOSIS — M542 Cervicalgia: Secondary | ICD-10-CM | POA: Diagnosis not present

## 2014-09-29 DIAGNOSIS — R21 Rash and other nonspecific skin eruption: Secondary | ICD-10-CM | POA: Diagnosis not present

## 2014-10-02 DIAGNOSIS — B029 Zoster without complications: Secondary | ICD-10-CM | POA: Diagnosis not present

## 2014-10-06 DIAGNOSIS — M25512 Pain in left shoulder: Secondary | ICD-10-CM | POA: Diagnosis not present

## 2014-10-26 ENCOUNTER — Other Ambulatory Visit: Payer: Self-pay

## 2014-10-26 DIAGNOSIS — N644 Mastodynia: Secondary | ICD-10-CM

## 2014-11-03 DIAGNOSIS — E782 Mixed hyperlipidemia: Secondary | ICD-10-CM | POA: Diagnosis not present

## 2014-11-03 DIAGNOSIS — R51 Headache: Secondary | ICD-10-CM | POA: Diagnosis not present

## 2014-11-03 DIAGNOSIS — R5383 Other fatigue: Secondary | ICD-10-CM | POA: Diagnosis not present

## 2014-11-03 DIAGNOSIS — I10 Essential (primary) hypertension: Secondary | ICD-10-CM | POA: Diagnosis not present

## 2014-11-03 DIAGNOSIS — E669 Obesity, unspecified: Secondary | ICD-10-CM | POA: Diagnosis not present

## 2014-11-03 DIAGNOSIS — R7309 Other abnormal glucose: Secondary | ICD-10-CM | POA: Diagnosis not present

## 2014-11-07 DIAGNOSIS — B0229 Other postherpetic nervous system involvement: Secondary | ICD-10-CM | POA: Diagnosis not present

## 2014-11-07 DIAGNOSIS — M25512 Pain in left shoulder: Secondary | ICD-10-CM | POA: Diagnosis not present

## 2014-11-07 DIAGNOSIS — M6281 Muscle weakness (generalized): Secondary | ICD-10-CM | POA: Diagnosis not present

## 2014-11-13 DIAGNOSIS — M6281 Muscle weakness (generalized): Secondary | ICD-10-CM | POA: Diagnosis not present

## 2014-11-13 DIAGNOSIS — R208 Other disturbances of skin sensation: Secondary | ICD-10-CM | POA: Diagnosis not present

## 2014-11-14 DIAGNOSIS — M542 Cervicalgia: Secondary | ICD-10-CM | POA: Diagnosis not present

## 2014-11-14 DIAGNOSIS — M25512 Pain in left shoulder: Secondary | ICD-10-CM | POA: Diagnosis not present

## 2014-11-14 DIAGNOSIS — R208 Other disturbances of skin sensation: Secondary | ICD-10-CM | POA: Diagnosis not present

## 2014-11-14 DIAGNOSIS — M6281 Muscle weakness (generalized): Secondary | ICD-10-CM | POA: Diagnosis not present

## 2014-11-17 DIAGNOSIS — R208 Other disturbances of skin sensation: Secondary | ICD-10-CM | POA: Diagnosis not present

## 2014-11-17 DIAGNOSIS — M6281 Muscle weakness (generalized): Secondary | ICD-10-CM | POA: Diagnosis not present

## 2014-11-21 DIAGNOSIS — M6281 Muscle weakness (generalized): Secondary | ICD-10-CM | POA: Diagnosis not present

## 2014-11-21 DIAGNOSIS — R208 Other disturbances of skin sensation: Secondary | ICD-10-CM | POA: Diagnosis not present

## 2014-11-23 DIAGNOSIS — R208 Other disturbances of skin sensation: Secondary | ICD-10-CM | POA: Diagnosis not present

## 2014-11-23 DIAGNOSIS — M6281 Muscle weakness (generalized): Secondary | ICD-10-CM | POA: Diagnosis not present

## 2014-11-28 DIAGNOSIS — M6281 Muscle weakness (generalized): Secondary | ICD-10-CM | POA: Diagnosis not present

## 2014-11-28 DIAGNOSIS — R208 Other disturbances of skin sensation: Secondary | ICD-10-CM | POA: Diagnosis not present

## 2014-11-29 DIAGNOSIS — N644 Mastodynia: Secondary | ICD-10-CM | POA: Diagnosis not present

## 2014-11-29 DIAGNOSIS — Z0000 Encounter for general adult medical examination without abnormal findings: Secondary | ICD-10-CM | POA: Diagnosis not present

## 2014-11-29 DIAGNOSIS — L905 Scar conditions and fibrosis of skin: Secondary | ICD-10-CM | POA: Diagnosis not present

## 2014-11-29 DIAGNOSIS — I10 Essential (primary) hypertension: Secondary | ICD-10-CM | POA: Diagnosis not present

## 2014-11-29 DIAGNOSIS — R7309 Other abnormal glucose: Secondary | ICD-10-CM | POA: Diagnosis not present

## 2014-11-30 DIAGNOSIS — M6281 Muscle weakness (generalized): Secondary | ICD-10-CM | POA: Diagnosis not present

## 2014-11-30 DIAGNOSIS — R208 Other disturbances of skin sensation: Secondary | ICD-10-CM | POA: Diagnosis not present

## 2014-12-07 DIAGNOSIS — R208 Other disturbances of skin sensation: Secondary | ICD-10-CM | POA: Diagnosis not present

## 2014-12-07 DIAGNOSIS — M6281 Muscle weakness (generalized): Secondary | ICD-10-CM | POA: Diagnosis not present

## 2014-12-11 ENCOUNTER — Other Ambulatory Visit: Payer: Self-pay | Admitting: Internal Medicine

## 2014-12-11 ENCOUNTER — Other Ambulatory Visit: Payer: Self-pay | Admitting: Nurse Practitioner

## 2014-12-11 DIAGNOSIS — N644 Mastodynia: Secondary | ICD-10-CM

## 2014-12-12 DIAGNOSIS — R208 Other disturbances of skin sensation: Secondary | ICD-10-CM | POA: Diagnosis not present

## 2014-12-12 DIAGNOSIS — M6281 Muscle weakness (generalized): Secondary | ICD-10-CM | POA: Diagnosis not present

## 2014-12-13 DIAGNOSIS — H5203 Hypermetropia, bilateral: Secondary | ICD-10-CM | POA: Diagnosis not present

## 2014-12-13 DIAGNOSIS — H25813 Combined forms of age-related cataract, bilateral: Secondary | ICD-10-CM | POA: Diagnosis not present

## 2014-12-14 DIAGNOSIS — R208 Other disturbances of skin sensation: Secondary | ICD-10-CM | POA: Diagnosis not present

## 2014-12-14 DIAGNOSIS — M6281 Muscle weakness (generalized): Secondary | ICD-10-CM | POA: Diagnosis not present

## 2014-12-18 ENCOUNTER — Ambulatory Visit
Admission: RE | Admit: 2014-12-18 | Discharge: 2014-12-18 | Disposition: A | Discharge status: Home / Self Care | Payer: Medicare Other | Source: Ambulatory Visit | Attending: Internal Medicine | Admitting: Internal Medicine

## 2014-12-18 DIAGNOSIS — N644 Mastodynia: Secondary | ICD-10-CM

## 2014-12-18 DIAGNOSIS — N6459 Other signs and symptoms in breast: Secondary | ICD-10-CM | POA: Diagnosis not present

## 2014-12-20 DIAGNOSIS — M6281 Muscle weakness (generalized): Secondary | ICD-10-CM | POA: Diagnosis not present

## 2014-12-20 DIAGNOSIS — R208 Other disturbances of skin sensation: Secondary | ICD-10-CM | POA: Diagnosis not present

## 2015-01-16 DIAGNOSIS — Z800 Family history of malignant neoplasm of digestive organs: Secondary | ICD-10-CM | POA: Diagnosis not present

## 2015-01-16 DIAGNOSIS — R109 Unspecified abdominal pain: Secondary | ICD-10-CM | POA: Diagnosis not present

## 2015-02-02 DIAGNOSIS — D122 Benign neoplasm of ascending colon: Secondary | ICD-10-CM | POA: Diagnosis not present

## 2015-02-02 DIAGNOSIS — Z1211 Encounter for screening for malignant neoplasm of colon: Secondary | ICD-10-CM | POA: Diagnosis not present

## 2015-02-02 DIAGNOSIS — D126 Benign neoplasm of colon, unspecified: Secondary | ICD-10-CM | POA: Diagnosis not present

## 2015-02-02 DIAGNOSIS — Z8371 Family history of colonic polyps: Secondary | ICD-10-CM | POA: Diagnosis not present

## 2015-02-02 DIAGNOSIS — D123 Benign neoplasm of transverse colon: Secondary | ICD-10-CM | POA: Diagnosis not present

## 2015-02-18 IMAGING — MG MM DIAGNOSTIC UNILATERAL L
2 series · 2 of 2 positions shown · non-contrast
Comparison: Previous exams.

CLINICAL DATA: Screening recall for a left breast mass.

EXAM:
DIGITAL DIAGNOSTIC  LEFT MAMMOGRAM
ULTRASOUND LEFT BREAST

[L MLO]
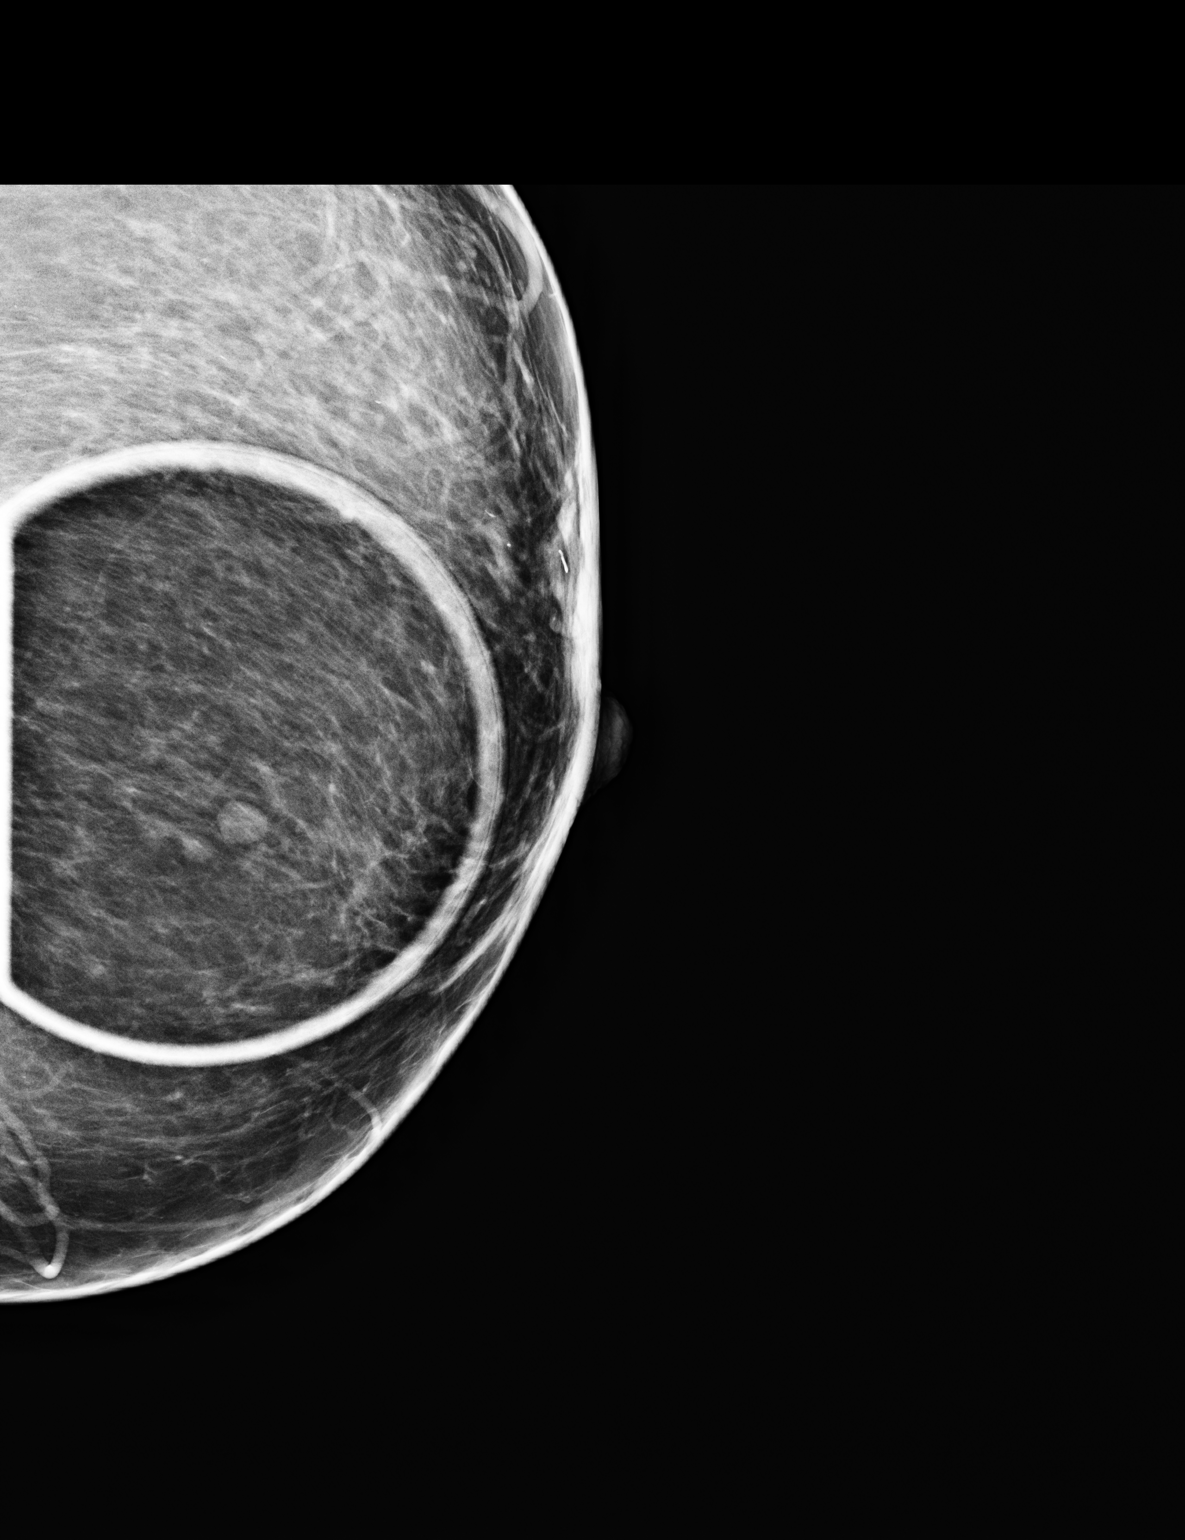

[L CC]
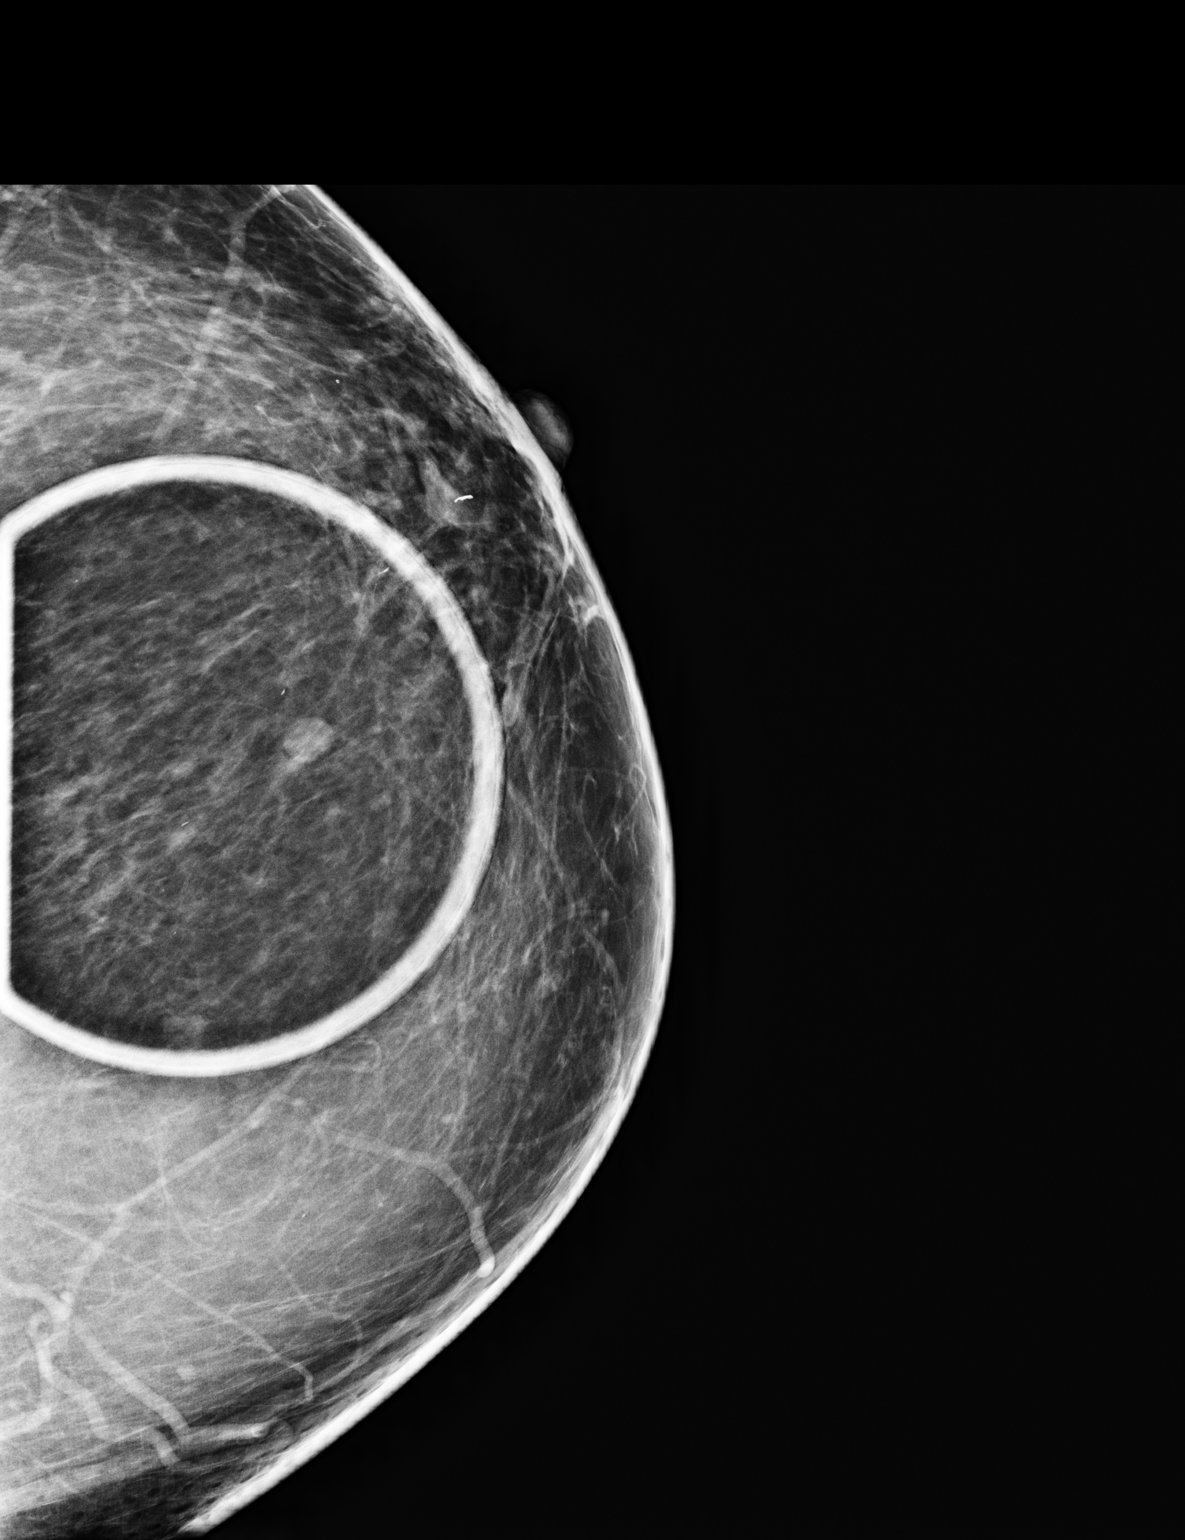

[2 of 2 positions shown; findings below may reference images not displayed]

ACR Breast Density Category b: There are scattered areas of
fibroglandular density.
FINDINGS: There is an oval circumscribed mass in the central to slightly
medial left breast measuring approximately 7 mm and confirmed on the
additional spot compression CC and MLO views.

Physical examination of the central and medial left breast does not
reveal any palpable masses.

Targeted ultrasound of the left breast was performed demonstrating
an oval circumscribed hypoechoic mass at 7 o'clock periareolar
measuring 0.4 x 0.3 x 0.5 cm.
IMPRESSION: Probably benign mass in the left breast at 7 o'clock periareolar,
likely a cyst although difficult to definitively characterize given
its deep location.

RECOMMENDATION:
A six-month follow-up left breast mammogram and ultrasound was
recommended, however the patient was very nervous regarding being
recalled from her screening exam and states she has a family history
of breast cancer and therefore requests cyst aspiration. This is
scheduled for November 10, 2013 at 8 a.m..

I have discussed the findings and recommendations with the patient.
Results were also provided in writing at the conclusion of the
visit. If applicable, a reminder letter will be sent to the patient
regarding the next appointment.

BI-RADS CATEGORY  3: Probably benign finding(s) - short interval
follow-up suggested.

## 2015-03-19 ENCOUNTER — Other Ambulatory Visit: Payer: Self-pay

## 2015-03-21 DIAGNOSIS — L02411 Cutaneous abscess of right axilla: Secondary | ICD-10-CM | POA: Diagnosis not present

## 2015-06-25 DIAGNOSIS — R252 Cramp and spasm: Secondary | ICD-10-CM | POA: Diagnosis not present

## 2015-06-25 DIAGNOSIS — Z23 Encounter for immunization: Secondary | ICD-10-CM | POA: Diagnosis not present

## 2015-06-25 DIAGNOSIS — I10 Essential (primary) hypertension: Secondary | ICD-10-CM | POA: Diagnosis not present

## 2015-06-25 DIAGNOSIS — E782 Mixed hyperlipidemia: Secondary | ICD-10-CM | POA: Diagnosis not present

## 2015-06-25 DIAGNOSIS — R7309 Other abnormal glucose: Secondary | ICD-10-CM | POA: Diagnosis not present

## 2015-09-11 IMAGING — CR DG CHEST 2V
2 series · 2 of 2 positions shown · non-contrast
Comparison: 07/12/2009

CLINICAL DATA: Shortness of breath.  Asthma.  Hypertension.

EXAM:
CHEST  2 VIEW

[w chest pa]
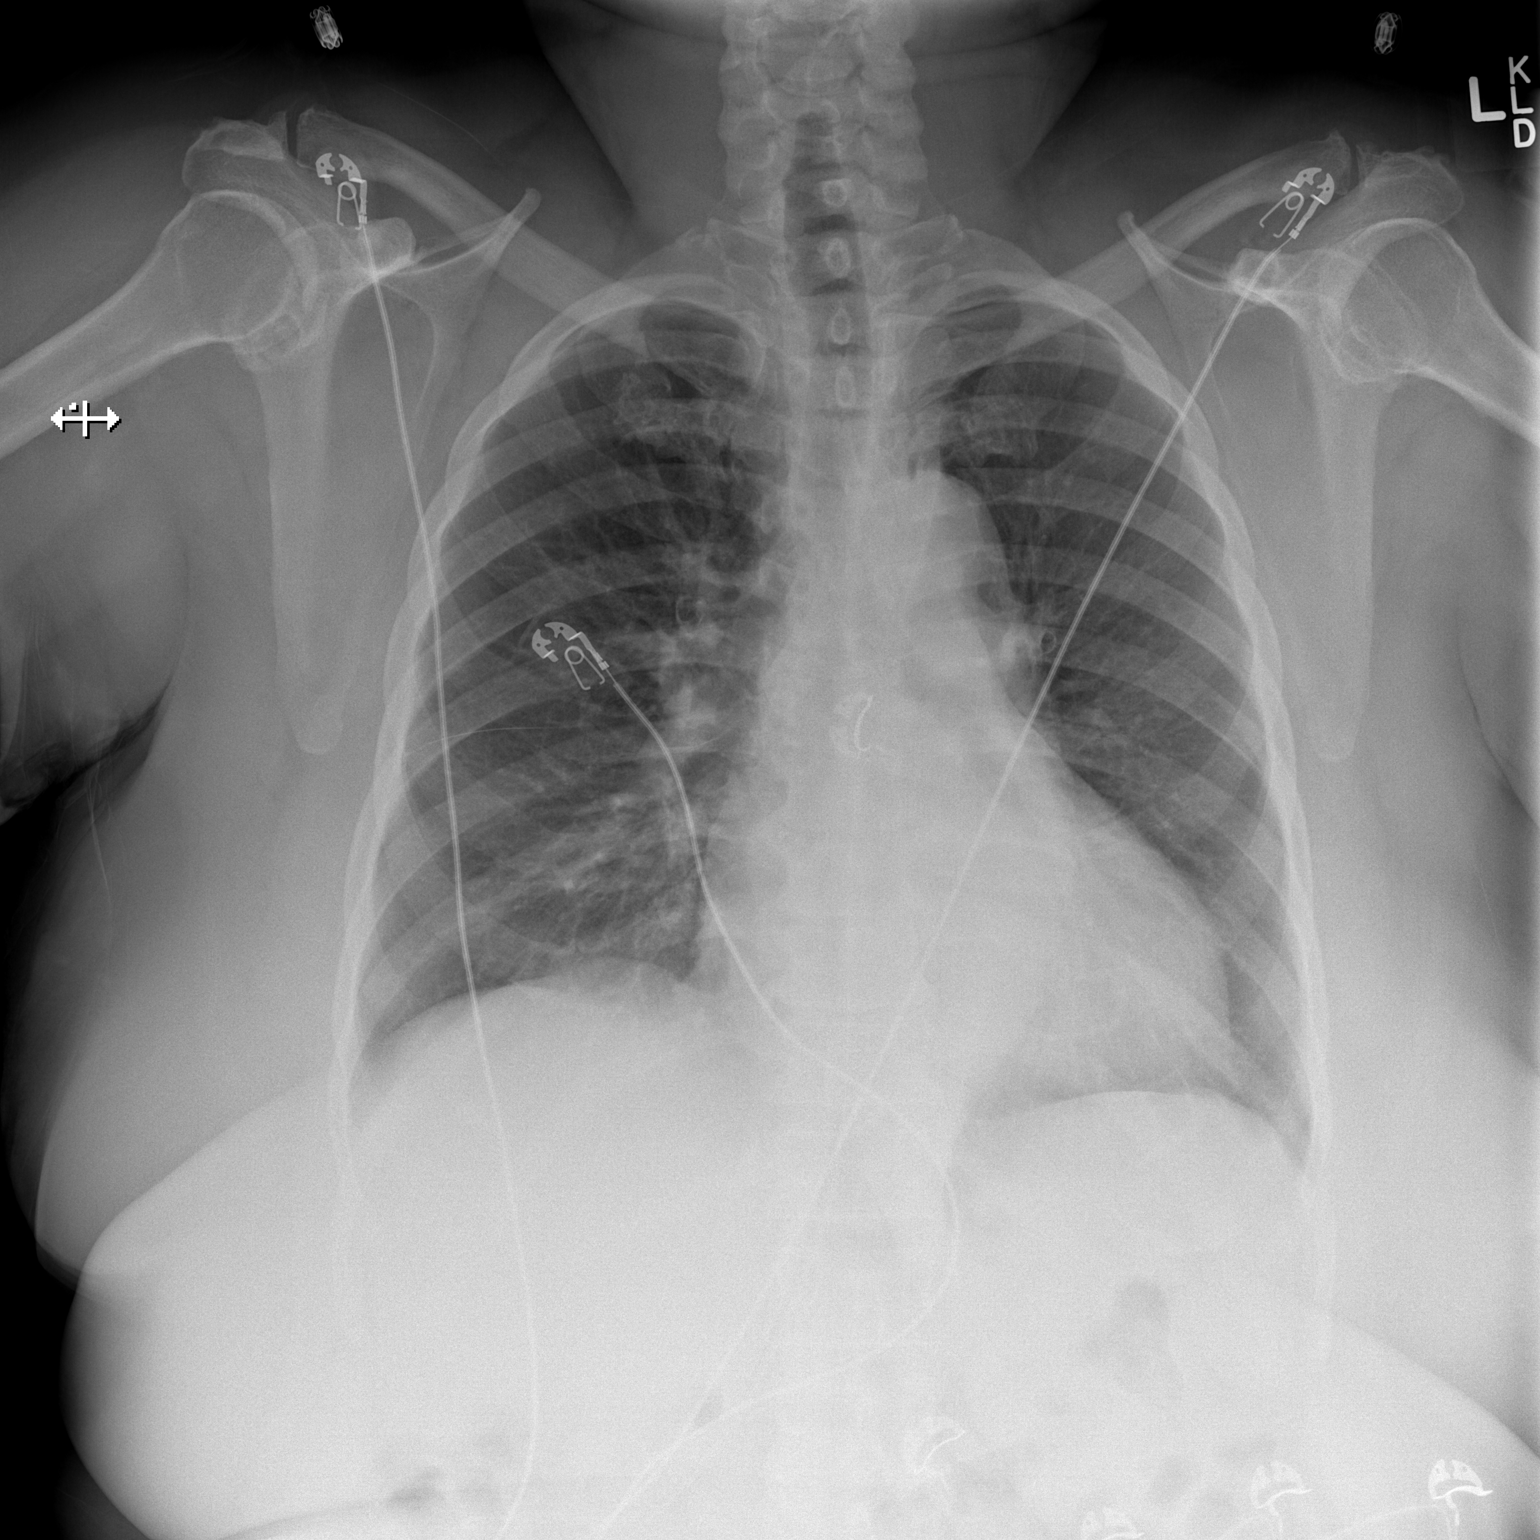

[w chest lat]
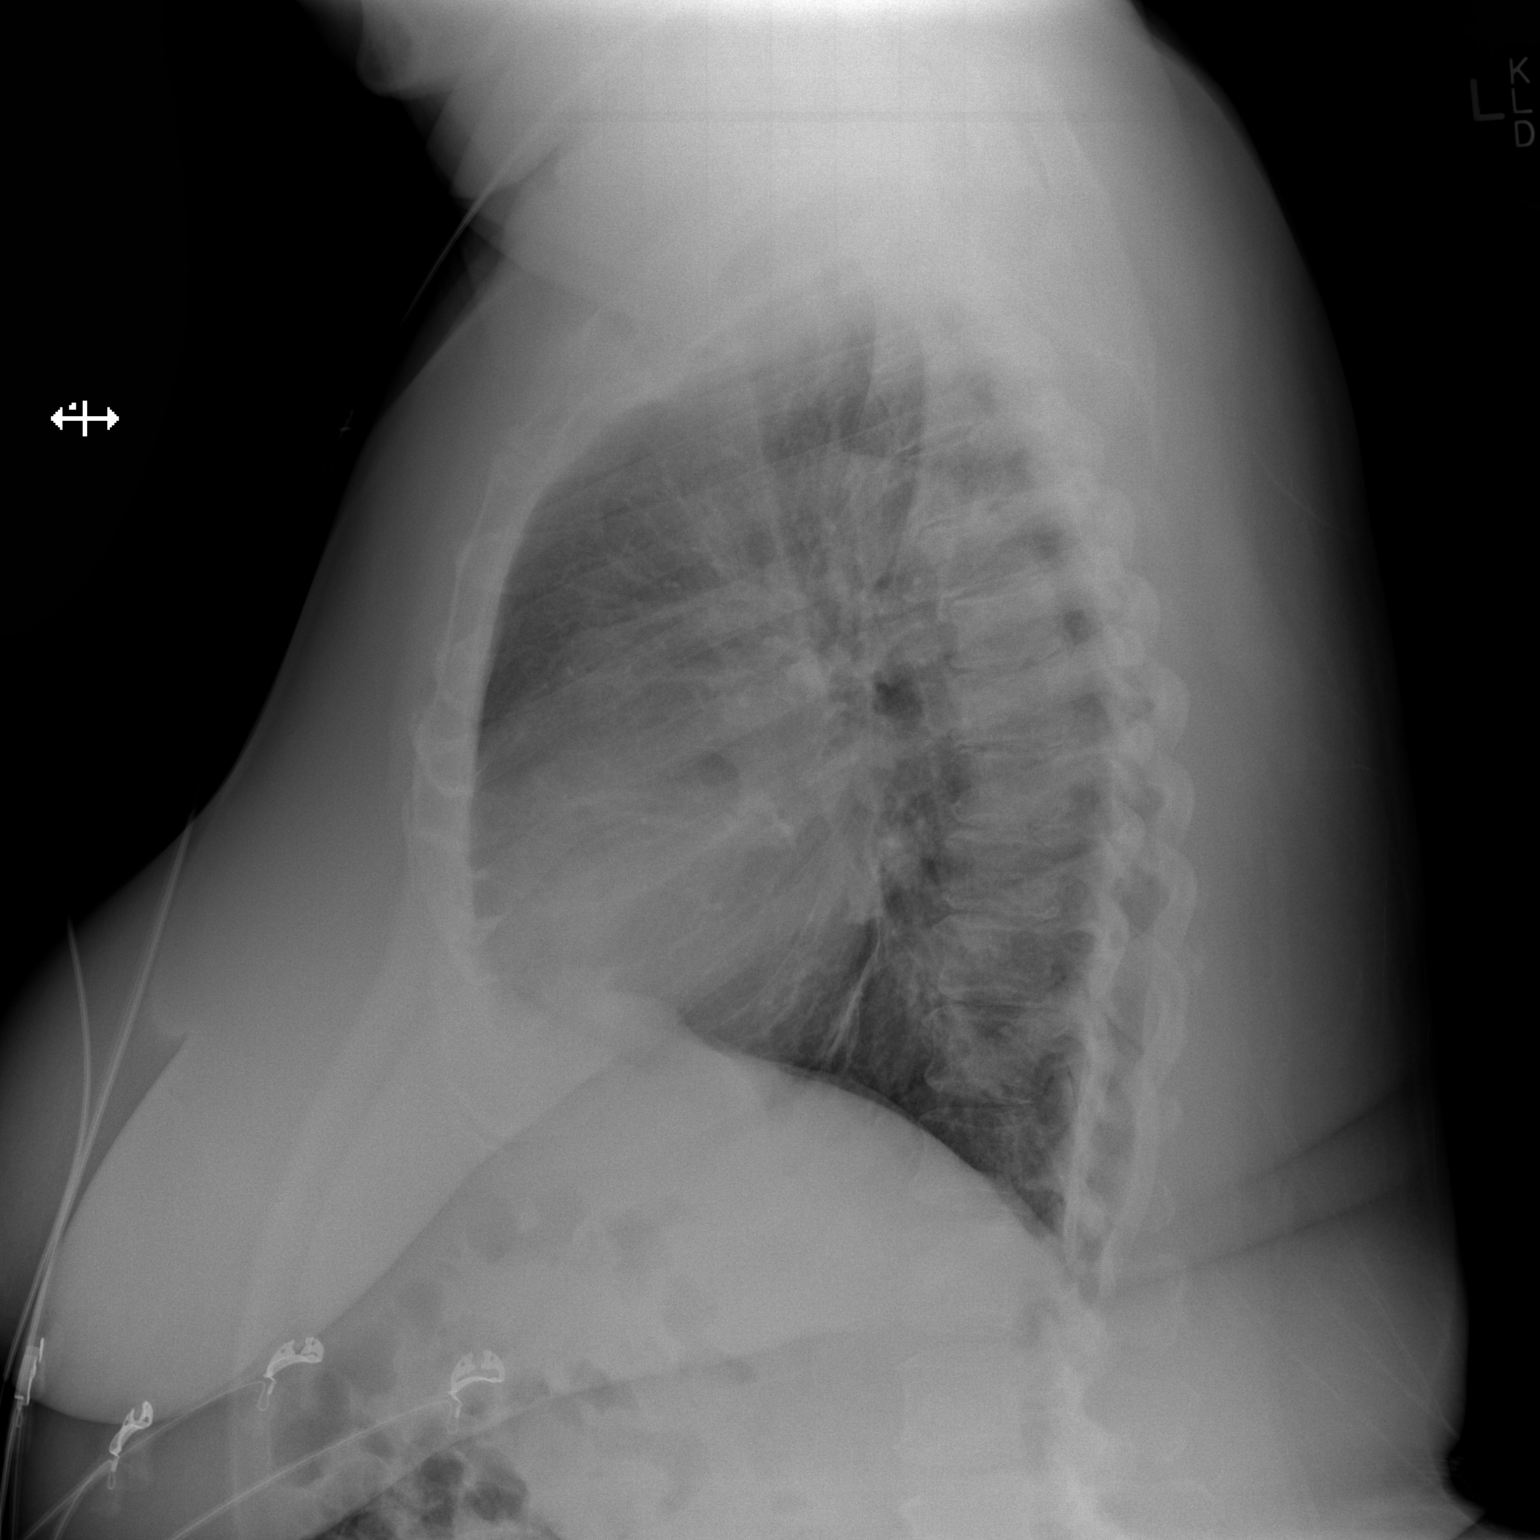

[2 of 2 positions shown; findings below may reference images not displayed]

FINDINGS: Mild cardiomegaly and ectasia of the thoracic aorta are stable.
Bilateral central peribronchial thickening and mild bibasilar
scarring is also unchanged. No evidence of acute infiltrate or
pulmonary edema. No evidence of pleural effusion.
IMPRESSION: Stable exam.  No active disease.

## 2015-10-31 DIAGNOSIS — M25551 Pain in right hip: Secondary | ICD-10-CM | POA: Diagnosis not present

## 2015-10-31 DIAGNOSIS — M542 Cervicalgia: Secondary | ICD-10-CM | POA: Diagnosis not present

## 2015-10-31 DIAGNOSIS — M25562 Pain in left knee: Secondary | ICD-10-CM | POA: Diagnosis not present

## 2015-11-06 DIAGNOSIS — M25562 Pain in left knee: Secondary | ICD-10-CM | POA: Diagnosis not present

## 2015-11-06 DIAGNOSIS — M1712 Unilateral primary osteoarthritis, left knee: Secondary | ICD-10-CM | POA: Diagnosis not present

## 2015-12-07 DIAGNOSIS — M79622 Pain in left upper arm: Secondary | ICD-10-CM | POA: Diagnosis not present

## 2015-12-11 DIAGNOSIS — M1711 Unilateral primary osteoarthritis, right knee: Secondary | ICD-10-CM | POA: Diagnosis not present

## 2015-12-11 DIAGNOSIS — M25561 Pain in right knee: Secondary | ICD-10-CM | POA: Diagnosis not present

## 2015-12-11 DIAGNOSIS — M1712 Unilateral primary osteoarthritis, left knee: Secondary | ICD-10-CM | POA: Diagnosis not present

## 2015-12-11 DIAGNOSIS — M25562 Pain in left knee: Secondary | ICD-10-CM | POA: Diagnosis not present

## 2015-12-17 DIAGNOSIS — J22 Unspecified acute lower respiratory infection: Secondary | ICD-10-CM | POA: Diagnosis not present

## 2015-12-17 DIAGNOSIS — R0602 Shortness of breath: Secondary | ICD-10-CM | POA: Diagnosis not present

## 2015-12-19 ENCOUNTER — Other Ambulatory Visit: Payer: Self-pay | Admitting: Internal Medicine

## 2015-12-19 DIAGNOSIS — N644 Mastodynia: Secondary | ICD-10-CM

## 2015-12-20 ENCOUNTER — Other Ambulatory Visit: Payer: Self-pay | Admitting: Internal Medicine

## 2015-12-20 DIAGNOSIS — N644 Mastodynia: Secondary | ICD-10-CM

## 2016-01-02 DIAGNOSIS — J22 Unspecified acute lower respiratory infection: Secondary | ICD-10-CM | POA: Diagnosis not present

## 2016-01-16 DIAGNOSIS — H52203 Unspecified astigmatism, bilateral: Secondary | ICD-10-CM | POA: Diagnosis not present

## 2016-01-16 DIAGNOSIS — H5203 Hypermetropia, bilateral: Secondary | ICD-10-CM | POA: Diagnosis not present

## 2016-01-16 DIAGNOSIS — H25813 Combined forms of age-related cataract, bilateral: Secondary | ICD-10-CM | POA: Diagnosis not present

## 2016-01-21 ENCOUNTER — Other Ambulatory Visit: Payer: Self-pay

## 2016-01-21 DIAGNOSIS — Z1231 Encounter for screening mammogram for malignant neoplasm of breast: Secondary | ICD-10-CM

## 2016-02-06 ENCOUNTER — Ambulatory Visit
Admission: RE | Admit: 2016-02-06 | Discharge: 2016-02-06 | Disposition: A | Discharge status: Home / Self Care | Payer: Medicare Other | Source: Ambulatory Visit

## 2016-02-06 DIAGNOSIS — Z1231 Encounter for screening mammogram for malignant neoplasm of breast: Secondary | ICD-10-CM

## 2016-02-15 DIAGNOSIS — R21 Rash and other nonspecific skin eruption: Secondary | ICD-10-CM | POA: Diagnosis not present

## 2016-02-27 DIAGNOSIS — R21 Rash and other nonspecific skin eruption: Secondary | ICD-10-CM | POA: Diagnosis not present

## 2016-02-27 DIAGNOSIS — J45998 Other asthma: Secondary | ICD-10-CM | POA: Diagnosis not present

## 2016-09-20 DIAGNOSIS — J069 Acute upper respiratory infection, unspecified: Secondary | ICD-10-CM | POA: Diagnosis not present

## 2016-09-24 DIAGNOSIS — B029 Zoster without complications: Secondary | ICD-10-CM | POA: Diagnosis not present

## 2016-11-12 DIAGNOSIS — J45909 Unspecified asthma, uncomplicated: Secondary | ICD-10-CM | POA: Diagnosis not present

## 2016-11-12 DIAGNOSIS — R202 Paresthesia of skin: Secondary | ICD-10-CM | POA: Diagnosis not present

## 2016-11-12 DIAGNOSIS — E782 Mixed hyperlipidemia: Secondary | ICD-10-CM | POA: Diagnosis not present

## 2016-11-12 DIAGNOSIS — I10 Essential (primary) hypertension: Secondary | ICD-10-CM | POA: Diagnosis not present

## 2017-01-21 DIAGNOSIS — H52203 Unspecified astigmatism, bilateral: Secondary | ICD-10-CM | POA: Diagnosis not present

## 2017-01-21 DIAGNOSIS — H5203 Hypermetropia, bilateral: Secondary | ICD-10-CM | POA: Diagnosis not present

## 2017-01-21 DIAGNOSIS — H524 Presbyopia: Secondary | ICD-10-CM | POA: Diagnosis not present

## 2017-01-21 DIAGNOSIS — H25813 Combined forms of age-related cataract, bilateral: Secondary | ICD-10-CM | POA: Diagnosis not present

## 2017-01-29 ENCOUNTER — Other Ambulatory Visit: Payer: Self-pay | Admitting: Internal Medicine

## 2017-01-29 DIAGNOSIS — Z1231 Encounter for screening mammogram for malignant neoplasm of breast: Secondary | ICD-10-CM

## 2017-02-20 ENCOUNTER — Ambulatory Visit
Admission: RE | Admit: 2017-02-20 | Discharge: 2017-02-20 | Disposition: A | Discharge status: Home / Self Care | Payer: Medicare Other | Source: Ambulatory Visit | Attending: Internal Medicine | Admitting: Internal Medicine

## 2017-02-20 DIAGNOSIS — Z1231 Encounter for screening mammogram for malignant neoplasm of breast: Secondary | ICD-10-CM | POA: Diagnosis not present

## 2017-02-27 ENCOUNTER — Ambulatory Visit (INDEPENDENT_AMBULATORY_CARE_PROVIDER_SITE_OTHER): Payer: Medicare Other | Admitting: Pulmonary Disease

## 2017-02-27 ENCOUNTER — Encounter: Payer: Self-pay | Admitting: Pulmonary Disease

## 2017-02-27 ENCOUNTER — Ambulatory Visit (INDEPENDENT_AMBULATORY_CARE_PROVIDER_SITE_OTHER)
Admission: RE | Admit: 2017-02-27 | Discharge: 2017-02-27 | Disposition: A | Discharge status: Home / Self Care | Payer: Medicare Other | Source: Ambulatory Visit | Attending: Pulmonary Disease | Admitting: Pulmonary Disease

## 2017-02-27 DIAGNOSIS — R0602 Shortness of breath: Secondary | ICD-10-CM

## 2017-02-27 DIAGNOSIS — G4719 Other hypersomnia: Secondary | ICD-10-CM | POA: Diagnosis not present

## 2017-02-27 DIAGNOSIS — R0609 Other forms of dyspnea: Secondary | ICD-10-CM | POA: Diagnosis not present

## 2017-02-27 LAB — NITRIC OXIDE: Nitric Oxide: 19

## 2017-02-27 NOTE — Patient Instructions (Addendum)
We will schedule you for chest x-ray, pulmonary function tests, methacholine challenge We will schedule you for for a split-night sleep study Continue using the Advair and albuterol If all these tests are negative then he may need a cardiology to evaluate ongoing dyspnea on exertion  Return to clinic in 3 months.

## 2017-02-27 NOTE — Progress Notes (Signed)
Carrie Figueroa    [id number]    September 18, 1946  Primary Care Physician:Sanders, Bailey Mech, MD  Referring Physician: Willene Hatchet, NP [address] [place], [place] [address]  Chief complaint:  Consult for evaluation of asthma, dyspnea  HPI: This Fukuhara is a 71 year old with morbid obesity, sleep apnea, asthma. She was diagnosed in 2001 in [place]. His symptoms have worsened after moved to [place] about 10 years ago. She was evaluated by Dr. Chase Caller the pulmonary clinic in 2015 but has not followed up since then. She continues on Advair and albuterol. She uses albuterol 1-2 times a week.. She reports sensitivity to heat, smells, dogs. She does not have any seasonal allergies, no acid reflux. The chief complaint is dyspnea on exertion. She reports rare dyspnea at rest, no cough, wheezing. She has history of OSA, not on CPAP currently. She was tested many years ago and was on CPAP but not for the last 4 years.   Pets:No dogs, cats, birds, exotic pets Occupation:Retired, used to work as a Comptroller Exposures:No asbestos, mold exposure. No hot tubs Smoking history: Social smoker. Quit in 78s.  Outpatient Encounter Prescriptions as of 02/27/2017  Medication Sig  . albuterol (PROAIR HFA) 108 (90 BASE) MCG/ACT inhaler Inhale 2 puffs into the lungs every 6 (six) hours as needed for wheezing or shortness of breath.  Marland Kitchen aspirin EC 81 MG tablet Take 81 mg by mouth daily.  . cetirizine (ZYRTEC) 10 MG tablet Take 10 mg by mouth daily.  . Fluticasone-Salmeterol (ADVAIR DISKUS) 100-50 MCG/DOSE AEPB Inhale 1 puff into the lungs 2 (two) times daily.  . Multiple Vitamin (MULTIVITAMIN WITH MINERALS) TABS tablet Take 2 tablets by mouth daily.  . nebivolol (BYSTOLIC) 5 MG tablet Take 5 mg by mouth daily.  . Olmesartan-Amlodipine-HCTZ (TRIBENZOR) 40-10-25 MG TABS Take 1 tablet by mouth daily.  . [DISCONTINUED] meloxicam (MOBIC) 15 MG tablet Take 15 mg by mouth daily as needed.  .  [DISCONTINUED] methocarbamol (ROBAXIN) 500 MG tablet Take 2 tablets (1,000 mg total) by mouth daily as needed for muscle spasms.   No facility-administered encounter medications on file as of 02/27/2017.     Allergies as of 02/27/2017  . (No Known Allergies)    Past Medical History:  Diagnosis Date  . Asthma   . CPAP (continuous positive airway pressure) dependence   . Hypertension   . Sleep apnea     Past Surgical History:  Procedure Laterality Date  . BREAST CYST ASPIRATION  11/10/2013  . BUNIONECTOMY    . tubal plasty  1974    Family History  Problem Relation Age of Onset  . Heart disease Mother   . Diabetes Mother   . Heart disease Father   . Diabetes Father   . Breast cancer Maternal Grandmother   . Breast cancer Sister     Social History   Social History  . Marital status: Widowed    Spouse name: N/A  . Number of children: N/A  . Years of education: N/A   Occupational History  . retired Retired   Social History Main Topics  . Smoking status: Former Smoker    Packs/day: 0.10    Years: 1.00    Types: Cigarettes  . Smokeless tobacco: Never Used  . Alcohol use Yes     Comment: rarely   . Drug use: No  . Sexual activity: Not Currently   Other Topics Concern  . Not on file   Social  History Narrative  . No narrative on file    Review of systems: Review of Systems  Constitutional: Negative for fever and chills.  HENT: Negative.   Eyes: Negative for blurred vision.  Respiratory: as per HPI  Cardiovascular: Negative for chest pain and palpitations.  Gastrointestinal: Negative for vomiting, diarrhea, blood per rectum. Genitourinary: Negative for dysuria, urgency, frequency and hematuria.  Musculoskeletal: Negative for myalgias, back pain and joint pain.  Skin: Negative for itching and rash.  Neurological: Negative for dizziness, tremors, focal weakness, seizures and loss of consciousness.  Endo/Heme/Allergies: Negative for environmental allergies.    Psychiatric/Behavioral: Negative for depression, suicidal ideas and hallucinations.  All other systems reviewed and are negative.  Physical Exam: There were no vitals taken for this visit. Gen:      No acute distress HEENT:  EOMI, sclera anicteric Neck:     No masses; no thyromegaly Lungs:    Clear to auscultation bilaterally; normal respiratory effort CV:         Regular rate and rhythm; no murmurs Abd:      + bowel sounds; soft, non-tender; no palpable masses, no distension Ext:    No edema; adequate peripheral perfusion Skin:      Warm and dry; no rash Neuro: alert and oriented x 3 Psych: normal mood and affect  Data Reviewed: Chest x-ray 05/28/14-no acute cardiopulmonary abnormality  FENO 02/27/17- 19  PFTs 03/10/14  FVC 1.63 [76%) FEV1 1.54 [93%) F/F 94 TLC 72% DLCO 92% Minimal restriction. No obstruction or diffusion abnormality  Assessment:  Consult for evaluation of dyspnea Mrs. Beckerman has a diagnosis of asthma but the symptoms are not very typical. She has low FENO in office. We will continue on the Advair, albuterol for now Reevaluate with repeat PFTs with methacholine challenge, chest x-ray We discussed her cardiopulmonary exercise test but she feels she cannot tolerate the treadmill or stationary bike If the workup is negative then she will need a cardiology workup for dyspnea on exertion  OSA Not on CPAP for many years. She'll need a repeat evaluation with a split-night sleep study.  Plan/Recommendations: - PFTs with methacholine challenge - CXR - Split night sleep study - Continue advair, albuterol.  Marshell Garfinkel MD [facility] Pager [phone] 02/27/2017, 3:49 PM  CC: Willene Hatchet, NP

## 2017-03-03 NOTE — Progress Notes (Signed)
Spoke with patient and informed her of results. She did not have any questions and verbalized understanding. Nothing further is needed.

## 2017-04-13 ENCOUNTER — Other Ambulatory Visit: Payer: Self-pay

## 2017-04-28 ENCOUNTER — Ambulatory Visit (HOSPITAL_BASED_OUTPATIENT_CLINIC_OR_DEPARTMENT_OTHER): Payer: Medicare Other | Attending: Pulmonary Disease | Admitting: Pulmonary Disease

## 2017-04-28 VITALS — Ht 62.5 in | Wt 264.0 lb

## 2017-04-28 DIAGNOSIS — G4719 Other hypersomnia: Secondary | ICD-10-CM | POA: Diagnosis not present

## 2017-04-28 DIAGNOSIS — I493 Ventricular premature depolarization: Secondary | ICD-10-CM | POA: Insufficient documentation

## 2017-04-28 DIAGNOSIS — Z79899 Other long term (current) drug therapy: Secondary | ICD-10-CM | POA: Diagnosis not present

## 2017-04-28 DIAGNOSIS — G4761 Periodic limb movement disorder: Secondary | ICD-10-CM | POA: Insufficient documentation

## 2017-04-28 DIAGNOSIS — I10 Essential (primary) hypertension: Secondary | ICD-10-CM | POA: Insufficient documentation

## 2017-04-28 DIAGNOSIS — R0683 Snoring: Secondary | ICD-10-CM | POA: Diagnosis not present

## 2017-04-28 DIAGNOSIS — R5383 Other fatigue: Secondary | ICD-10-CM | POA: Diagnosis not present

## 2017-05-01 ENCOUNTER — Telehealth: Payer: Self-pay | Admitting: Pulmonary Disease

## 2017-05-01 DIAGNOSIS — G4719 Other hypersomnia: Secondary | ICD-10-CM | POA: Diagnosis not present

## 2017-05-01 NOTE — Procedures (Signed)
Patient Name: Carrie Figueroa, Carrie Figueroa Date: 04/28/2017 Gender: Female D.O.B: 1946-04-22 Age (years): 34 Referring Provider: Marshell Garfinkel Height (inches): 63 Interpreting Physician: Kara Mead MD, ABSM Weight (lbs): 264 RPSGT: Carolin Coy BMI: 48 MRN: [id number] Neck Size: 15.00   CLINICAL INFORMATION Sleep Study Type: NPSG  Indication for sleep study: Excessive Daytime Sleepiness, Fatigue, Hypertension  Epworth Sleepiness Score: 11  SLEEP STUDY TECHNIQUE As per the AASM Manual for the Scoring of Sleep and Associated Events v2.3 (April 2016) with a hypopnea requiring 4% desaturations.  The channels recorded and monitored were frontal, central and occipital EEG, electrooculogram (EOG), submentalis EMG (chin), nasal and oral airflow, thoracic and abdominal wall motion, anterior tibialis EMG, snore microphone, electrocardiogram, and pulse oximetry.  MEDICATIONS Medications self-administered by patient taken the night of the study : IBUPROFEN  SLEEP ARCHITECTURE The study was initiated at 10:48:50 PM and ended at 5:01:49 AM.  Sleep onset time was 3.6 minutes and the sleep efficiency was 88.5%. The total sleep time was 330.0 minutes.  Stage REM latency was 105.0 minutes.  The patient spent 13.48% of the night in stage N1 sleep, 70.30% in stage N2 sleep, 0.00% in stage N3 and 16.21% in REM.  Alpha intrusion was absent.  Supine sleep was 18.33%.  RESPIRATORY PARAMETERS The overall apnea/hypopnea index (AHI) was 1.6 per hour. There were 6 total apneas, including 5 obstructive, 0 central and 1 mixed apneas. There were 3 hypopneas and 6 RERAs.  The AHI during Stage REM sleep was 10.1 per hour.  AHI while supine was 0.0 per hour.  The mean oxygen saturation was 94.48%. The minimum SpO2 during sleep was 88.00%.  Soft snoring was noted during this study.  CARDIAC DATA The 2 lead EKG demonstrated sinus rhythm. The mean heart rate was 57.23 beats per minute. Other EKG  findings include: PVCs.   LEG MOVEMENT DATA The total PLMS were 35 with a resulting PLMS index of 6.36. Associated arousal with leg movement index was 0.9 .  IMPRESSIONS - No significant obstructive sleep apnea occurred during this study (AHI = 1.6/h). - No significant central sleep apnea occurred during this study (CAI = 0.0/h). - Mild oxygen desaturation was noted during this study (Min O2 = 88.00%). - The patient snored with Soft snoring volume. - EKG findings include PVCs. - Mild periodic limb movements of sleep occurred during the study. No significant associated arousals. - DIAGNOSIS - No evidence of sleep disordered breathing - Mild PLMs   RECOMMENDATIONS - Avoid alcohol, sedatives and other CNS depressants that may worsen sleep apnea and disrupt normal sleep architecture. - Sleep hygiene should be reviewed to assess factors that may improve sleep quality. - Weight management and regular exercise should be initiated or continued if appropriate.   Kara Mead MD Board Certified in [place]

## 2017-05-01 NOTE — Telephone Encounter (Signed)
Pt is aware of results and voiced her understanding. Nothing further needed.     Notes recorded by Marshell Garfinkel, MD on 05/01/2017 at 1:59 PM EDT Please let the patient know that the sleep study does not show evidence of sleep apnea. No treatment is needed.

## 2017-06-22 ENCOUNTER — Ambulatory Visit: Payer: Medicare Other | Admitting: Pulmonary Disease

## 2017-09-07 DIAGNOSIS — R7309 Other abnormal glucose: Secondary | ICD-10-CM | POA: Diagnosis not present

## 2017-09-07 DIAGNOSIS — E782 Mixed hyperlipidemia: Secondary | ICD-10-CM | POA: Diagnosis not present

## 2017-09-07 DIAGNOSIS — Z0000 Encounter for general adult medical examination without abnormal findings: Secondary | ICD-10-CM | POA: Diagnosis not present

## 2017-09-07 DIAGNOSIS — R296 Repeated falls: Secondary | ICD-10-CM | POA: Diagnosis not present

## 2017-09-07 DIAGNOSIS — I10 Essential (primary) hypertension: Secondary | ICD-10-CM | POA: Diagnosis not present

## 2017-09-07 DIAGNOSIS — R0602 Shortness of breath: Secondary | ICD-10-CM | POA: Diagnosis not present

## 2017-09-07 DIAGNOSIS — E559 Vitamin D deficiency, unspecified: Secondary | ICD-10-CM | POA: Diagnosis not present

## 2017-09-07 DIAGNOSIS — Z23 Encounter for immunization: Secondary | ICD-10-CM | POA: Diagnosis not present

## 2017-10-22 ENCOUNTER — Ambulatory Visit: Payer: Medicare Other | Admitting: Cardiovascular Disease

## 2017-10-24 ENCOUNTER — Encounter: Payer: Self-pay | Admitting: Cardiology

## 2017-10-24 NOTE — Progress Notes (Deleted)
Cardiology Office Note   Date:  10/24/2017   ID:  Carrie Figueroa, DOB 02/11/1946, MRN [id number]  PCP:  Glendale Chard, MD  Cardiologist:   No primary care provider on file. Referring:  ***  No chief complaint on file.     History of Present Illness: Carrie Figueroa is a 72 y.o. female who presents for ***     Past Medical History:  Diagnosis Date  . Asthma   . CPAP (continuous positive airway pressure) dependence   . Hypertension   . Sleep apnea     Past Surgical History:  Procedure Laterality Date  . BREAST CYST ASPIRATION  11/10/2013  . BUNIONECTOMY    . tubal plasty  1974     Current Outpatient Medications  Medication Sig Dispense Refill  . albuterol (PROAIR HFA) 108 (90 BASE) MCG/ACT inhaler Inhale 2 puffs into the lungs every 6 (six) hours as needed for wheezing or shortness of breath. 1 Inhaler 3  . aspirin EC 81 MG tablet Take 81 mg by mouth daily.    . cetirizine (ZYRTEC) 10 MG tablet Take 10 mg by mouth daily.    . Fluticasone-Salmeterol (ADVAIR DISKUS) 100-50 MCG/DOSE AEPB Inhale 1 puff into the lungs 2 (two) times daily. 60 each 5  . Multiple Vitamin (MULTIVITAMIN WITH MINERALS) TABS tablet Take 2 tablets by mouth daily.    . nebivolol (BYSTOLIC) 5 MG tablet Take 5 mg by mouth daily.    . Olmesartan-Amlodipine-HCTZ (TRIBENZOR) 40-10-25 MG TABS Take 1 tablet by mouth daily.     No current facility-administered medications for this visit.     Allergies:   Patient has no known allergies.    Social History:  The patient  reports that she has quit smoking. Her smoking use included cigarettes. She has a 0.10 pack-year smoking history. she has never used smokeless tobacco. She reports that she drinks alcohol. She reports that she does not use drugs.   Family History:  The patient's ***family history includes Breast cancer in her maternal grandmother and sister; Diabetes in her father and mother; Heart disease in her father and mother.    ROS:  Please see the  history of present illness.   Otherwise, review of systems are positive for {NONE DEFAULTED:18576::"none"}.   All other systems are reviewed and negative.    PHYSICAL EXAM: VS:  There were no vitals taken for this visit. , BMI There is no height or weight on file to calculate BMI. GENERAL:  Well appearing HEENT:  Pupils equal round and reactive, fundi not visualized, oral mucosa unremarkable NECK:  No jugular venous distention, waveform within normal limits, carotid upstroke brisk and symmetric, no bruits, no thyromegaly LYMPHATICS:  No cervical, inguinal adenopathy LUNGS:  Clear to auscultation bilaterally BACK:  No CVA tenderness CHEST:  Unremarkable HEART:  PMI not displaced or sustained,S1 and S2 within normal limits, no S3, no S4, no clicks, no rubs, *** murmurs ABD:  Flat, positive bowel sounds normal in frequency in pitch, no bruits, no rebound, no guarding, no midline pulsatile mass, no hepatomegaly, no splenomegaly EXT:  2 plus pulses throughout, no edema, no cyanosis no clubbing SKIN:  No rashes no nodules NEURO:  Cranial nerves II through XII grossly intact, motor grossly intact throughout PSYCH:  Cognitively intact, oriented to person place and time    EKG:  EKG {ACTION; IS/IS HYI:[id number]} ordered today. The ekg ordered today demonstrates ***   Recent Labs: No results found for requested labs within last 8760 hours.  Lipid Panel No results found for: CHOL, TRIG, HDL, CHOLHDL, VLDL, LDLCALC, LDLDIRECT    Wt Readings from Last 3 Encounters:  04/28/17 264 lb (119.7 kg)  06/12/14 273 lb (123.8 kg)  05/28/14 276 lb (125.2 kg)      Other studies Reviewed: Additional studies/ records that were reviewed today include: ***. Review of the above records demonstrates:  Please see elsewhere in the note.  ***   ASSESSMENT AND PLAN:  *** SOB:  ***  HTN:  ***   Current medicines are reviewed at length with the patient today.  The patient {ACTIONS; HAS/DOES NOT  HAVE:19233} concerns regarding medicines.  The following changes have been made:  {PLAN; NO CHANGE:13088:s}  Labs/ tests ordered today include: *** No orders of the defined types were placed in this encounter.    Disposition:   FU with ***    Signed, Minus Breeding, MD  10/24/2017 8:58 PM    [place]

## 2017-10-26 ENCOUNTER — Telehealth: Payer: Self-pay

## 2017-10-26 ENCOUNTER — Ambulatory Visit: Payer: Medicare Other | Admitting: Cardiology

## 2017-10-26 NOTE — Telephone Encounter (Signed)
FAXED NOTES TO [facility] 

## 2017-11-11 ENCOUNTER — Telehealth: Payer: Self-pay | Admitting: Cardiology

## 2017-11-11 NOTE — Telephone Encounter (Signed)
Received incoming records from [facility] for upcoming appointment on 11/23/17 @ 9am with Dr. Percival Spanish. Records located in Medical Records. 11/11/17 ab

## 2017-11-21 NOTE — Progress Notes (Signed)
Cardiology Office Note   Date:  11/23/2017   ID:  Carrie Figueroa, DOB 1946-03-04, MRN [id number]  PCP:  Glendale Chard, MD  Cardiologist:   No primary care provider on file. Referring:  Marshell Garfinkel, MD  Chief Complaint  Patient presents with  . Shortness of Breath      History of Present Illness: Carrie Figueroa is a 72 y.o. female who presents for evaluation of SOB.  She is had long-standing shortness of breath.  When she was living in [place] she was diagnosed with asthma and sleep apnea.  She seen a pulmonologist here and most recently was told on her PFTs that she did not have asthma and her sleep study did not suggest sleep apnea.  She continues however to get short of breath.  She will be short of breath walking several yards on level ground.  She has to stop what she is doing and rest.  This has been chronic but slowly progressive as well.  She walks every day long hallway to go see her mom who is in a nursing home.  She denies any chest pressure, neck or arm discomfort.  She does not notice any significant palpitations, presyncope or syncope.  She has no PND or orthopnea.  Her weights have slowly gone up over time.  She has some mild edema at times.   Past Medical History:  Diagnosis Date  . Hypertension     Past Surgical History:  Procedure Laterality Date  . BREAST CYST ASPIRATION  11/10/2013  . BUNIONECTOMY    . tubal plasty  1974     Current Outpatient Medications  Medication Sig Dispense Refill  . albuterol (PROAIR HFA) 108 (90 BASE) MCG/ACT inhaler Inhale 2 puffs into the lungs every 6 (six) hours as needed for wheezing or shortness of breath. 1 Inhaler 3  . Ascorbic Acid (VITAMIN C) 1000 MG tablet Take 1,000 mg by mouth 2 (two) times a week.    Marland Kitchen aspirin EC 81 MG tablet Take 81 mg by mouth daily.    . Cyanocobalamin (VITAMIN B 12 PO) Take 1 tablet by mouth 2 (two) times a week.    . Fluticasone-Salmeterol (ADVAIR DISKUS) 100-50 MCG/DOSE AEPB Inhale 1 puff  into the lungs 2 (two) times daily. 60 each 5  . Multiple Vitamin (MULTIVITAMIN WITH MINERALS) TABS tablet Take 2 tablets by mouth daily.    . nebivolol (BYSTOLIC) 5 MG tablet Take 5 mg by mouth daily.    . Olmesartan-Amlodipine-HCTZ (TRIBENZOR) 40-10-25 MG TABS Take 1 tablet by mouth daily.     No current facility-administered medications for this visit.     Allergies:   Patient has no known allergies.    Social History:  The patient  reports that she has quit smoking. Her smoking use included cigarettes. She has a 0.10 pack-year smoking history. she has never used smokeless tobacco. She reports that she drinks alcohol. She reports that she does not use drugs.   Family History:  The patient's family history includes Breast cancer in her maternal grandmother and sister; Diabetes in her father and mother; Heart disease in her father and mother.    ROS:  Please see the history of present illness.   Otherwise, review of systems are positive for none.   All other systems are reviewed and negative.    PHYSICAL EXAM: VS:  BP (!) 182/98 (BP Location: Right Arm, Patient Position: Sitting, Cuff Size: Normal)   Pulse 65   Ht 5\' 2"  (  1.575 m)   Wt 269 lb (122 kg)   BMI 49.20 kg/m  , BMI Body mass index is 49.2 kg/m. GENERAL:  Well appearing HEENT:  Pupils equal round and reactive, fundi not visualized, oral mucosa unremarkable NECK:  No jugular venous distention, waveform within normal limits, carotid upstroke brisk and symmetric, no bruits, no thyromegaly LYMPHATICS:  No cervical, inguinal adenopathy LUNGS:  Clear to auscultation bilaterally BACK:  No CVA tenderness CHEST:  Unremarkable HEART:  PMI not displaced or sustained,S1 and S2 within normal limits, no S3, no S4, no clicks, no rubs, no murmurs ABD:  Flat, positive bowel sounds normal in frequency in pitch, no bruits, no rebound, no guarding, no midline pulsatile mass, no hepatomegaly, no splenomegaly EXT:  2 plus pulses throughout,  non pitting edema, no cyanosis no clubbing SKIN:  No rashes no nodules NEURO:  Cranial nerves II through XII grossly intact, motor grossly intact throughout PSYCH:  Cognitively intact, oriented to person place and time    EKG:  EKG is ordered today. The ekg ordered today demonstrates sinus rhythm, rate 65, axis within normal limits, intervals within normal limits, no acute   Recent Labs: No results found for requested labs within last 8760 hours.    Lipid Panel No results found for: CHOL, TRIG, HDL, CHOLHDL, VLDL, LDLCALC, LDLDIRECT    Wt Readings from Last 3 Encounters:  11/23/17 269 lb (122 kg)  04/28/17 264 lb (119.7 kg)  06/12/14 273 lb (123.8 kg)      Other studies Reviewed: Additional studies/ records that were reviewed today include: Labs, Pulm Records. Review of the above records demonstrates:  Please see elsewhere in the note.     ASSESSMENT AND PLAN:   SOB: I suspect that this is multifactorial.  It might be related to weight and deconditioning.  However, I am going to get a BNP  level.  I am going to screen for obstructive coronary disease.  She would not be able to walk on a treadmill.  Therefore, she will have a [employer].  I will check a BNP level.      HTN:  I repeated the blood pressure and the reading was 128/70.  No change in therapy.   MORBID OBESITY:  The patient understands the need to lose weight with diet and exercise. We have discussed specific strategies for this.  We talked about specific strategies.    Current medicines are reviewed at length with the patient today.  The patient does not have concerns regarding medicines.  The following changes have been made:  no change  Labs/ tests ordered today include:   Orders Placed This Encounter  Procedures  . B Nat Peptide  . MYOCARDIAL PERFUSION IMAGING     Disposition:   FU with me based on the results of the above.     Signed, Minus Breeding, MD  11/23/2017 9:53 AM    [facility]

## 2017-11-23 ENCOUNTER — Ambulatory Visit (INDEPENDENT_AMBULATORY_CARE_PROVIDER_SITE_OTHER): Payer: Medicare Other | Admitting: Cardiology

## 2017-11-23 ENCOUNTER — Encounter: Payer: Self-pay | Admitting: Cardiology

## 2017-11-23 VITALS — BP 182/98 | HR 65 | Ht 62.0 in | Wt 269.0 lb

## 2017-11-23 DIAGNOSIS — I10 Essential (primary) hypertension: Secondary | ICD-10-CM | POA: Diagnosis not present

## 2017-11-23 DIAGNOSIS — R0602 Shortness of breath: Secondary | ICD-10-CM | POA: Diagnosis not present

## 2017-11-23 NOTE — Patient Instructions (Addendum)
Medication Instructions:  Continue current medications  If you need a refill on your cardiac medications before your next appointment, please call your pharmacy.  Labwork: BNP Today  Testing/Procedures: Your physician has requested that you have a lexiscan myoview. For further information please visit [email or web address]. Please follow instruction sheet, as given.   Follow-Up: Your physician wants you to follow-up in: As Needed.    Thank you for choosing [facility] at [facility]!!

## 2017-11-24 LAB — BRAIN NATRIURETIC PEPTIDE: BNP: 27.4 pg/mL (ref 0.0–100.0)

## 2017-11-26 ENCOUNTER — Telehealth: Payer: Self-pay | Admitting: Cardiology

## 2017-11-26 NOTE — Telephone Encounter (Signed)
Follow Up:      Returning your call from today. 

## 2017-11-26 NOTE — Addendum Note (Signed)
Addended by: Vennie Homans on: 11/26/2017 09:07 AM   Modules accepted: Orders

## 2017-11-30 NOTE — Telephone Encounter (Signed)
Follow Up: ° ° ° ° °Returning your call from last week. °

## 2017-11-30 NOTE — Telephone Encounter (Signed)
Pt aware of her blood work 

## 2017-12-10 ENCOUNTER — Telehealth (HOSPITAL_COMMUNITY): Payer: Self-pay

## 2017-12-10 NOTE — Telephone Encounter (Signed)
Encounter complete. 

## 2017-12-15 ENCOUNTER — Ambulatory Visit (HOSPITAL_COMMUNITY): Payer: Medicare Other

## 2017-12-16 ENCOUNTER — Ambulatory Visit (HOSPITAL_COMMUNITY): Payer: Medicare Other

## 2017-12-29 ENCOUNTER — Telehealth (HOSPITAL_COMMUNITY): Payer: Self-pay

## 2017-12-29 NOTE — Telephone Encounter (Signed)
Encounter complete. 

## 2017-12-31 ENCOUNTER — Ambulatory Visit (HOSPITAL_COMMUNITY)
Admission: RE | Admit: 2017-12-31 | Discharge: 2017-12-31 | Disposition: A | Discharge status: Home / Self Care | Payer: Medicare Other | Source: Ambulatory Visit | Attending: Cardiology | Admitting: Cardiology

## 2017-12-31 DIAGNOSIS — R0602 Shortness of breath: Secondary | ICD-10-CM | POA: Insufficient documentation

## 2017-12-31 DIAGNOSIS — R42 Dizziness and giddiness: Secondary | ICD-10-CM | POA: Diagnosis not present

## 2017-12-31 DIAGNOSIS — I10 Essential (primary) hypertension: Secondary | ICD-10-CM | POA: Diagnosis not present

## 2017-12-31 DIAGNOSIS — R002 Palpitations: Secondary | ICD-10-CM | POA: Diagnosis not present

## 2017-12-31 DIAGNOSIS — Z8249 Family history of ischemic heart disease and other diseases of the circulatory system: Secondary | ICD-10-CM | POA: Insufficient documentation

## 2017-12-31 DIAGNOSIS — Z6841 Body Mass Index (BMI) 40.0 and over, adult: Secondary | ICD-10-CM | POA: Diagnosis not present

## 2017-12-31 DIAGNOSIS — I2510 Atherosclerotic heart disease of native coronary artery without angina pectoris: Secondary | ICD-10-CM | POA: Diagnosis not present

## 2017-12-31 DIAGNOSIS — R0609 Other forms of dyspnea: Secondary | ICD-10-CM | POA: Diagnosis not present

## 2017-12-31 DIAGNOSIS — Z87891 Personal history of nicotine dependence: Secondary | ICD-10-CM | POA: Insufficient documentation

## 2017-12-31 DIAGNOSIS — E669 Obesity, unspecified: Secondary | ICD-10-CM | POA: Insufficient documentation

## 2017-12-31 MED ORDER — REGADENOSON 0.4 MG/5ML IV SOLN: 0.4000 mg | Freq: Once | INTRAVENOUS | Status: DC

## 2017-12-31 MED ORDER — TECHNETIUM TC 99M TETROFOSMIN IV KIT
32.5000 | PACK | Freq: Once | INTRAVENOUS | Status: DC | PRN
  Filled 2017-12-31: qty 33

## 2018-01-01 ENCOUNTER — Ambulatory Visit (HOSPITAL_COMMUNITY)
Admission: RE | Admit: 2018-01-01 | Discharge: 2018-01-01 | Disposition: A | Discharge status: Home / Self Care | Payer: Medicare Other | Source: Ambulatory Visit | Attending: Cardiovascular Disease | Admitting: Cardiovascular Disease

## 2018-01-01 LAB — MYOCARDIAL PERFUSION IMAGING
CHL CUP NUCLEAR SRS: 1
CHL CUP NUCLEAR SSS: 2
LVDIAVOL: 106 mL (ref 46–106)
LVSYSVOL: 31 mL
Peak HR: 70 {beats}/min
Rest HR: 57 {beats}/min
SDS: 1
TID: 0.81

## 2018-01-01 MED ORDER — TECHNETIUM TC 99M TETROFOSMIN IV KIT
30.5000 | PACK | Freq: Once | INTRAVENOUS | Status: AC | PRN
  Administered 2018-01-01: 30.5 via INTRAVENOUS

## 2018-01-14 ENCOUNTER — Telehealth: Payer: Self-pay | Admitting: Cardiology

## 2018-01-14 NOTE — Telephone Encounter (Signed)
Called patient to set up initial care guide visit. Scheduled for 4/30 at 3:30 pm.

## 2018-01-19 ENCOUNTER — Ambulatory Visit (INDEPENDENT_AMBULATORY_CARE_PROVIDER_SITE_OTHER): Payer: Medicare Other

## 2018-01-19 DIAGNOSIS — Z0000 Encounter for general adult medical examination without abnormal findings: Secondary | ICD-10-CM

## 2018-01-19 NOTE — Progress Notes (Signed)
Week: 1  Progress Notes: Pt says provider recommended she lose 4lbs a month. Pt eats out a lot due to her living alone but does enjoy fresh produce. Says she would like to feel better overall, and lose about 50lbs/ Goal weight being 220~ for now. Says she has some sort of pain everyday. Would feel more committed to being active if she had something fun to do. Does not eat red meat or pork, but does like poultry/chicken. Tries to eat low-sodium foods, but says she can be heavy on the salt if she's not careful. Does not typically cook fried foods but will eat them from fast food restaurants.   Challenges: Pt says she has a hard time working out and being active due to stiffness/pain in her joints. Says she needs a partner to hold her accountable  Opportunities: Pt would like information on the Mediterranean diet, would like to join a Zumba class as well as a line dancing class.  Client Commitment/Agreement for Next Session: Care Guide will reach out for more information on Mediterranean diet and put together information, as well as line dancing classes. Pt agreed to sign up for the [facility] senior center, and was provided with class information. Expressed interest in aquatics classes. Pt also agreed to incorporate at least one serving of fruit or vegetable with each meal.

## 2018-02-02 ENCOUNTER — Ambulatory Visit (INDEPENDENT_AMBULATORY_CARE_PROVIDER_SITE_OTHER): Payer: Medicare Other

## 2018-02-02 DIAGNOSIS — Z0000 Encounter for general adult medical examination without abnormal findings: Secondary | ICD-10-CM

## 2018-02-02 NOTE — Progress Notes (Signed)
Week:  2 Progress Notes:  Pt says she knows she hasn't eaten like she should. Has ate out twice since last session. Says she hasn't enrolled in the [facility] just yet but plans to this week.   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 Opportunities:  Provided patient with line dancing classes for seniors. Patient is very interested and has agreed to attend a class at least once. Pt was provided with information on Mediterranean diet (as requested) as well as guidelines for eating out. Patient's PCP recommended the Zone diet, requested information on what that is.  Client Commitment/Agreement for Next Session:  Pt agreed to clean out Brita pitcher so that she can start to drink more water. Also agreed to sign up for senior center and attend a dance class. Has also agreed to try to incorporate the Mediterranean diet. Pt also says she would love to attend church again, says it would be nice to be around others. Mentioned she would like to go on 5/26 for a performance. Next session 5/28 at 3:30

## 2018-02-16 ENCOUNTER — Ambulatory Visit (INDEPENDENT_AMBULATORY_CARE_PROVIDER_SITE_OTHER): Payer: Medicare Other

## 2018-02-16 DIAGNOSIS — Z0000 Encounter for general adult medical examination without abnormal findings: Secondary | ICD-10-CM

## 2018-02-17 DIAGNOSIS — H25813 Combined forms of age-related cataract, bilateral: Secondary | ICD-10-CM | POA: Diagnosis not present

## 2018-02-17 DIAGNOSIS — H43393 Other vitreous opacities, bilateral: Secondary | ICD-10-CM | POA: Diagnosis not present

## 2018-02-17 NOTE — Progress Notes (Signed)
Week: 3  Progress Notes: Pt says that she has been trying to watch what she eats. Has tried to incorporate some of the Mediterranean diet. She did bring out her Brita filter and has began to start increasing her consumption. Has also signed up for an aquatics class through the [facility]  Challenges:  Patient is having issues with portion size. Opportunities:  Patient has a portion plate that she can use. Client Commitment/Agreement for Next Session: Provided pt with portion tips. Has agreed to use portion plate. Will provide update for classes on follow up 6/11 at 3:30. Would like information on evening dance classes, as well as educational classes for seniors.

## 2018-03-02 ENCOUNTER — Ambulatory Visit (INDEPENDENT_AMBULATORY_CARE_PROVIDER_SITE_OTHER): Payer: Medicare Other

## 2018-03-02 DIAGNOSIS — Z0000 Encounter for general adult medical examination without abnormal findings: Secondary | ICD-10-CM

## 2018-03-02 NOTE — Progress Notes (Signed)
Week: 4  Progress Notes: Pt says she has been attending her water aerobics classes, and has really enjoyed. Says she doesn't know if she has lost weight but feels really good about herself physically and mentally. Says she enjoys coming to the Care Guide sessions as it has helped her mentally begin to prepare for change and set goals.   Challenges:   Opportunities: Has been working on her portion size, would like to cut down even more on her meat consumption.   Client Commitment/Agreement for Next Session: Pt will continue with aerobics classes and work on her portion size. As well as reducing her carb consumption. Would like more information on swimming classes after her six sessions with the [facility] are over.

## 2018-03-19 ENCOUNTER — Telehealth: Payer: Self-pay

## 2018-03-19 NOTE — Telephone Encounter (Signed)
Called to reschedule apt. Pt agreed to July 9 at 3:30

## 2018-03-30 ENCOUNTER — Ambulatory Visit (INDEPENDENT_AMBULATORY_CARE_PROVIDER_SITE_OTHER): Payer: Medicare Other

## 2018-03-30 DIAGNOSIS — Z0000 Encounter for general adult medical examination without abnormal findings: Secondary | ICD-10-CM

## 2018-03-30 NOTE — Progress Notes (Signed)
Week: 5  Progress Notes: Pt mentioned that she has been going to the [facility] for water aerobics class. Does not like Mediterranean diet as much will work on it moving forward. Completed wellness wheel where pt identified that she would like to work part time, will look into resources.    Client Commitment/Agreement for Next Session: Will work on water consumption, as well as portion size. Pt next session 7/23 at 3:30

## 2018-04-12 DIAGNOSIS — H2513 Age-related nuclear cataract, bilateral: Secondary | ICD-10-CM | POA: Diagnosis not present

## 2018-04-13 ENCOUNTER — Ambulatory Visit (INDEPENDENT_AMBULATORY_CARE_PROVIDER_SITE_OTHER): Payer: Medicare Other

## 2018-04-13 DIAGNOSIS — Z0000 Encounter for general adult medical examination without abnormal findings: Secondary | ICD-10-CM

## 2018-04-13 NOTE — Progress Notes (Signed)
Week: 6  Progress Notes: Pt says she has had some good days and bad days. Has been eating more salads, but recently made pasta and feels that it set her back. Has started incorporating low sodium meats and dressings in her salads as well as incorporating olive oil. Discussed plans moving forward. Water aerobics class has ended but plans to renew registration. Has been trying to increase water consumption, encouraged pt to keep up with hydration, especially during current weather.   Client Commitment/Agreement for Next Session: Client agreed to renew registration for water aerobics, as well as to take time to complete self care act for at least one hour. Patient agreed to pedicure/manicure. Next apt 8/6 at 3:30

## 2018-04-27 ENCOUNTER — Ambulatory Visit: Payer: Medicare Other

## 2018-05-03 ENCOUNTER — Telehealth: Payer: Self-pay

## 2018-05-03 ENCOUNTER — Other Ambulatory Visit: Payer: Self-pay | Admitting: Nurse Practitioner

## 2018-05-03 DIAGNOSIS — Z1231 Encounter for screening mammogram for malignant neoplasm of breast: Secondary | ICD-10-CM

## 2018-05-03 NOTE — Telephone Encounter (Signed)
Called to reschedule last visit. Left Message

## 2018-06-07 ENCOUNTER — Ambulatory Visit: Payer: Medicare Other

## 2018-06-09 ENCOUNTER — Telehealth: Payer: Self-pay

## 2018-06-09 NOTE — Telephone Encounter (Signed)
Called for f/u/. No answer , left message.

## 2018-07-08 ENCOUNTER — Ambulatory Visit
Admission: RE | Admit: 2018-07-08 | Discharge: 2018-07-08 | Disposition: A | Discharge status: Home / Self Care | Payer: Medicare Other | Source: Ambulatory Visit | Attending: Nurse Practitioner | Admitting: Nurse Practitioner

## 2018-07-08 DIAGNOSIS — Z1231 Encounter for screening mammogram for malignant neoplasm of breast: Secondary | ICD-10-CM

## 2018-08-12 ENCOUNTER — Other Ambulatory Visit: Payer: Self-pay

## 2018-09-09 ENCOUNTER — Ambulatory Visit (INDEPENDENT_AMBULATORY_CARE_PROVIDER_SITE_OTHER): Payer: Medicare Other | Admitting: Nurse Practitioner

## 2018-09-09 ENCOUNTER — Ambulatory Visit (INDEPENDENT_AMBULATORY_CARE_PROVIDER_SITE_OTHER): Payer: Medicare Other

## 2018-09-09 VITALS — BP 142/76 | HR 60 | Temp 98.3°F | Ht 61.4 in | Wt 266.0 lb

## 2018-09-09 DIAGNOSIS — R7303 Prediabetes: Secondary | ICD-10-CM | POA: Diagnosis not present

## 2018-09-09 DIAGNOSIS — R0609 Other forms of dyspnea: Secondary | ICD-10-CM | POA: Diagnosis not present

## 2018-09-09 DIAGNOSIS — Z23 Encounter for immunization: Secondary | ICD-10-CM

## 2018-09-09 DIAGNOSIS — Z0000 Encounter for general adult medical examination without abnormal findings: Secondary | ICD-10-CM

## 2018-09-09 DIAGNOSIS — I10 Essential (primary) hypertension: Secondary | ICD-10-CM | POA: Diagnosis not present

## 2018-09-09 DIAGNOSIS — Z79899 Other long term (current) drug therapy: Secondary | ICD-10-CM | POA: Diagnosis not present

## 2018-09-09 LAB — POCT URINALYSIS DIPSTICK
Bilirubin, UA: NEGATIVE
Glucose, UA: NEGATIVE
KETONES UA: NEGATIVE
Leukocytes, UA: NEGATIVE
Nitrite, UA: NEGATIVE
Protein, UA: NEGATIVE
Spec Grav, UA: 1.02 (ref 1.010–1.025)
Urobilinogen, UA: 0.2 E.U./dL
pH, UA: 7 (ref 5.0–8.0)

## 2018-09-09 LAB — CBC
HEMOGLOBIN: 13.3 g/dL (ref 11.1–15.9)
Hematocrit: 40.1 % (ref 34.0–46.6)
MCH: 28.1 pg (ref 26.6–33.0)
MCHC: 33.2 g/dL (ref 31.5–35.7)
MCV: 85 fL (ref 79–97)
Platelets: 338 10*3/uL (ref 150–450)
RBC: 4.74 x10E6/uL (ref 3.77–5.28)
RDW: 13 % (ref 12.3–15.4)
WBC: 7.5 10*3/uL (ref 3.4–10.8)

## 2018-09-09 LAB — CMP14 + ANION GAP
ALT: 14 IU/L (ref 0–32)
AST: 20 IU/L (ref 0–40)
Albumin/Globulin Ratio: 1.6 (ref 1.2–2.2)
Albumin: 4.1 g/dL (ref 3.5–4.8)
Alkaline Phosphatase: 72 IU/L (ref 39–117)
Anion Gap: 15 mmol/L (ref 10.0–18.0)
BILIRUBIN TOTAL: 0.4 mg/dL (ref 0.0–1.2)
BUN/Creatinine Ratio: 19 (ref 12–28)
BUN: 17 mg/dL (ref 8–27)
CHLORIDE: 99 mmol/L (ref 96–106)
CO2: 26 mmol/L (ref 20–29)
Calcium: 9.6 mg/dL (ref 8.7–10.3)
Creatinine, Ser: 0.88 mg/dL (ref 0.57–1.00)
GFR calc Af Amer: 76 mL/min/{1.73_m2} (ref >59)
GFR calc non Af Amer: 66 mL/min/{1.73_m2} (ref >59)
Globulin, Total: 2.6 g/dL (ref 1.5–4.5)
Glucose: 103 mg/dL — ABNORMAL HIGH (ref 65–99)
Potassium: 3.6 mmol/L (ref 3.5–5.2)
Sodium: 140 mmol/L (ref 134–144)
Total Protein: 6.7 g/dL (ref 6.0–8.5)

## 2018-09-09 LAB — POCT UA - MICROALBUMIN
Albumin/Creatinine Ratio, Urine, POC: 30
Creatinine, POC: 200 mg/dL
Microalbumin Ur, POC: 10 mg/L

## 2018-09-09 LAB — HEMOGLOBIN A1C
ESTIMATED AVERAGE GLUCOSE: 114 mg/dL
HEMOGLOBIN A1C: 5.6 % (ref 4.8–5.6)

## 2018-09-09 MED ORDER — ALBUTEROL SULFATE HFA 108 (90 BASE) MCG/ACT IN AERS: 2.0000 | INHALATION_SPRAY | Freq: Four times a day (QID) | RESPIRATORY_TRACT | 3 refills | Status: DC | PRN

## 2018-09-09 NOTE — Progress Notes (Signed)
Subjective:   Carrie Figueroa is a 72 y.o. female who presents for Medicare Annual (Subsequent) preventive examination.  Review of Systems:  n/a Cardiac Risk Factors include: advanced age (>21men, >44 women);dyslipidemia;obesity (BMI >30kg/m2);sedentary lifestyle     Objective:     Vitals: BP (!) 142/76 (BP Location: Left Arm, Patient Position: Sitting)   Pulse 60   Temp 98.3 F (36.8 C) (Oral)   Ht 5' 1.4" (1.56 m)   Wt 266 lb (120.7 kg)   BMI 49.61 kg/m   Body mass index is 49.61 kg/m.  Advanced Directives 09/09/2018 04/28/2017 05/28/2014  Does Patient Have a Medical Advance Directive? No No No  Would patient like information on creating a medical advance directive? Yes (MAU/Ambulatory/Procedural Areas - Information given) No - Patient declined No - patient declined information    Tobacco Social History   Tobacco Use  Smoking Status Former Smoker  . Packs/day: 0.10  . Years: 1.00  . Pack years: 0.10  . Types: Cigarettes  Smokeless Tobacco Never Used     Counseling given: Not Answered   Clinical Intake:  Pre-visit preparation completed: Yes  Pain : 0-10 Pain Score: 2  Pain Type: Acute pain Pain Location: Abdomen Pain Orientation: Other (Comment)(different areas) Pain Descriptors / Indicators: Sharp, Throbbing Pain Onset: 1 to 4 weeks ago(making appointment for GI) Pain Frequency: Intermittent Pain Relieving Factors: goes away on its own Effect of Pain on Daily Activities: noticeable  Pain Relieving Factors: goes away on its own  Nutritional Status: BMI > 30  Obese Nutritional Risks: None Diabetes: No  How often do you need to have someone help you when you read instructions, pamphlets, or other written materials from your doctor or pharmacy?: 1 - Never  Interpreter Needed?: No  Information entered by :: NAllen LPN  Past Medical History:  Diagnosis Date  . Hypertension    Past Surgical History:  Procedure Laterality Date  . BREAST CYST ASPIRATION   11/10/2013  . BUNIONECTOMY    . tubal plasty  1974   Family History  Problem Relation Age of Onset  . Heart disease Mother        CHF  . Diabetes Mother   . Heart disease Father   . Diabetes Father   . Breast cancer Maternal Grandmother   . Breast cancer Sister    Social History   Socioeconomic History  . Marital status: Single    Spouse name: Not on file  . Number of children: Not on file  . Years of education: Not on file  . Highest education level: Not on file  Occupational History  . Occupation: Med Engineer, production: RETIRED  Social Needs  . Financial resource strain: Not hard at all  . Food insecurity:    Worry: Never true    Inability: Never true  . Transportation needs:    Medical: No    Non-medical: No  Tobacco Use  . Smoking status: Former Smoker    Packs/day: 0.10    Years: 1.00    Pack years: 0.10    Types: Cigarettes  . Smokeless tobacco: Never Used  Substance and Sexual Activity  . Alcohol use: Yes    Comment: rarely   . Drug use: No  . Sexual activity: Not Currently  Lifestyle  . Physical activity:    Days per week: 0 days    Minutes per session: 0 min  . Stress: Only a little  Relationships  . Social connections:    Talks  on phone: Not on file    Gets together: Not on file    Attends religious service: Not on file    Active member of club or organization: Not on file    Attends meetings of clubs or organizations: Not on file    Relationship status: Not on file  Other Topics Concern  . Not on file  Social History Narrative   Lives alone.      Outpatient Encounter Medications as of 09/09/2018  Medication Sig  . Ascorbic Acid (VITAMIN C) 1000 MG tablet Take 1,000 mg by mouth 2 (two) times a week.  Marland Kitchen aspirin EC 81 MG tablet Take 81 mg by mouth daily.  . Cyanocobalamin (VITAMIN B 12 PO) Take 1 tablet by mouth 2 (two) times a week.  . Multiple Vitamin (MULTIVITAMIN WITH MINERALS) TABS tablet Take 2 tablets by mouth daily.  . nebivolol  (BYSTOLIC) 5 MG tablet Take 5 mg by mouth daily.  . Olmesartan-Amlodipine-HCTZ (TRIBENZOR) 40-10-25 MG TABS Take 1 tablet by mouth daily.  . [DISCONTINUED] albuterol (PROAIR HFA) 108 (90 BASE) MCG/ACT inhaler Inhale 2 puffs into the lungs every 6 (six) hours as needed for wheezing or shortness of breath.  . [DISCONTINUED] Fluticasone-Salmeterol (ADVAIR DISKUS) 100-50 MCG/DOSE AEPB Inhale 1 puff into the lungs 2 (two) times daily.   No facility-administered encounter medications on file as of 09/09/2018.     Activities of Daily Living In your present state of health, do you have any difficulty performing the following activities: 09/09/2018  Hearing? N  Vision? Y  Comment needs surgery  Difficulty concentrating or making decisions? N  Walking or climbing stairs? Y  Comment pains  Dressing or bathing? N  Doing errands, shopping? N  Preparing Food and eating ? N  Using the Toilet? N  In the past six months, have you accidently leaked urine? Y  Comment if held too long  Do you have problems with loss of bowel control? N  Managing your Medications? N  Managing your Finances? N  Housekeeping or managing your Housekeeping? N  Some recent data might be hidden    Patient Care Team: Glendale Chard, MD as PCP - General (Internal Medicine)    Assessment:   This is a routine wellness examination for Carrie Figueroa.  Exercise Activities and Dietary recommendations Current Exercise Habits: The patient does not participate in regular exercise at present  Goals    . Exercise 150 min/wk Moderate Activity (pt-stated)       Fall Risk Fall Risk  09/09/2018 04/13/2017  Falls in the past year? 1 No  Comment - Emmi Telephone Survey: data to providers prior to load  Number falls in past yr: 1 -  Comment got foot caught -  Injury with Fall? 0 -  Risk for fall due to : History of fall(s);Impaired mobility -   Is the patient's home free of loose throw rugs in walkways, pet beds, electrical cords,  etc?   yes      Grab bars in the bathroom? no      Handrails on the stairs?   n/a      Adequate lighting?   yes  Timed Get Up and Go performed: n/a  Depression Screen PHQ 2/9 Scores 09/09/2018  PHQ - 2 Score 1  PHQ- 9 Score 4     Cognitive Function     6CIT Screen 09/09/2018  What Year? 0 points  What time? 0 points  Count back from 20 0 points  Months in reverse  0 points  Repeat phrase 0 points    Immunization History  Administered Date(s) Administered  . Influenza, High Dose Seasonal PF 09/09/2018    Qualifies for Shingles Vaccine? yes  Screening Tests Health Maintenance  Topic Date Due  . Hepatitis C Screening  April 16, 1946  . DEXA SCAN  10/31/2011  . PNA vac Low Risk Adult (1 of 2 - PCV13) 10/31/2011  . INFLUENZA VACCINE  04/22/2018  . MAMMOGRAM  07/08/2020  . TETANUS/TDAP  12/09/2023  . COLONOSCOPY  02/25/2025    Cancer Screenings: Lung: Low Dose CT Chest recommended if Age 52-80 years, 30 pack-year currently smoking OR have quit w/in 15years. Patient does not qualify. Breast:  Up to date on Mammogram? Yes   Up to date of Bone Density/Dexa? No Colorectal: up to date  Additional Screenings: : Hepatitis C Screening: due     Plan:    Wants to start exercising regularly. Wants to discuss flu vaccine with PCP.   I have personally reviewed and noted the following in the patient's chart:   . Medical and social history . Use of alcohol, tobacco or illicit drugs  . Current medications and supplements . Functional ability and status . Nutritional status . Physical activity . Advanced directives . List of other physicians . Hospitalizations, surgeries, and ER visits in previous 12 months . Vitals . Screenings to include cognitive, depression, and falls . Referrals and appointments  In addition, I have reviewed and discussed with patient certain preventive protocols, quality metrics, and best practice recommendations. A written personalized care plan for  preventive services as well as general preventive health recommendations were provided to patient.     Kellie Simmering, LPN  [id number]

## 2018-09-09 NOTE — Patient Instructions (Signed)
Health Maintenance After Age 71 After age 71, you are at a higher risk for certain long-term diseases and infections as well as injuries from falls. Falls are a major cause of broken bones and head injuries in people who are older than age 71. Getting regular preventive care can help to keep you healthy and well. Preventive care includes getting regular testing and making lifestyle changes as recommended by your health care provider. Talk with your health care provider about:  Which screenings and tests you should have. A screening is a test that checks for a disease when you have no symptoms.  A diet and exercise plan that is right for you. What should I know about screenings and tests to prevent falls? Screening and testing are the best ways to find a health problem early. Early diagnosis and treatment give you the best chance of managing medical conditions that are common after age 71. Certain conditions and lifestyle choices may make you more likely to have a fall. Your health care provider may recommend:  Regular vision checks. Poor vision and conditions such as cataracts can make you more likely to have a fall. If you wear glasses, make sure to get your prescription updated if your vision changes.  Medicine review. Work with your health care provider to regularly review all of the medicines you are taking, including over-the-counter medicines. Ask your health care provider about any side effects that may make you more likely to have a fall. Tell your health care provider if any medicines that you take make you feel dizzy or sleepy.  Osteoporosis screening. Osteoporosis is a condition that causes the bones to get weaker. This can make the bones weak and cause them to break more easily.  Blood pressure screening. Blood pressure changes and medicines to control blood pressure can make you feel dizzy.  Strength and balance checks. Your health care provider may recommend certain tests to check your  strength and balance while standing, walking, or changing positions.  Foot health exam. Foot pain and numbness, as well as not wearing proper footwear, can make you more likely to have a fall.  Depression screening. You may be more likely to have a fall if you have a fear of falling, feel emotionally low, or feel unable to do activities that you used to do.  Alcohol use screening. Using too much alcohol can affect your balance and may make you more likely to have a fall. What actions can I take to lower my risk of falls? General instructions  Talk with your health care provider about your risks for falling. Tell your health care provider if: ? You fall. Be sure to tell your health care provider about all falls, even ones that seem minor. ? You feel dizzy, sleepy, or off-balance.  Take over-the-counter and prescription medicines only as told by your health care provider. These include any supplements.  Eat a healthy diet and maintain a healthy weight. A healthy diet includes low-fat dairy products, low-fat (lean) meats, and fiber from whole grains, beans, and lots of fruits and vegetables. Home safety  Remove any tripping hazards, such as rugs, cords, and clutter.  Install safety equipment such as grab bars in bathrooms and safety rails on stairs.  Keep rooms and walkways well-lit. Activity   Follow a regular exercise program to stay fit. This will help you maintain your balance. Ask your health care provider what types of exercise are appropriate for you.  If you need a cane or   walker, use it as recommended by your health care provider.  Wear supportive shoes that have nonskid soles. Lifestyle  Do not drink alcohol if your health care provider tells you not to drink.  If you drink alcohol, limit how much you have: ? 0-1 drink a day for women. ? 0-2 drinks a day for men.  Be aware of how much alcohol is in your drink. In the [place] one drink equals one typical bottle of beer (12  oz), one-half glass of wine (5 oz), or one shot of hard liquor (1 oz).  Do not use any products that contain nicotine or tobacco, such as cigarettes and e-cigarettes. If you need help quitting, ask your health care provider. Summary  Having a healthy lifestyle and getting preventive care can help to protect your health and wellness after age 62.  Screening and testing are the best way to find a health problem early and help you avoid having a fall. Early diagnosis and treatment give you the best chance for managing medical conditions that are more common for people who are older than age 24.  Falls are a major cause of broken bones and head injuries in people who are older than age 15. Take precautions to prevent a fall at home.  Work with your health care provider to learn what changes you can make to improve your health and wellness and to prevent falls. This information is not intended to replace advice given to you by your health care provider. Make sure you discuss any questions you have with your health care provider. Document Released: 07/22/2017 Document Revised: 07/22/2017 Document Reviewed: 07/22/2017 Elsevier Interactive Patient Education  2019 [place] Sizes A serving size is a measured amount of food or drink, such as one slice of bread, that has an associated nutrient content. Knowing the serving size of a food or drink can help you determine how much of that food you should consume. What is the size of one serving? The size of one healthy serving depends on the food or drink. To determine a serving size, read the food label. If the food or drink does not have a food label, try to find serving size information online. Or, use the following to estimate the size of one adult serving: Grain 1 slice bread.  bagel.  cup pasta. Vegetable  cup cooked or canned vegetables. 1 cup raw, leafy greens. Fruit  cup canned fruit. 1 medium fruit.  cup dried fruit. Meat  and Other Protein Sources 1 oz meat, poultry, or fish.  cup cooked beans. 1 egg.  cup nuts or seeds. 1 Tbsp nut butter.  cup tofu or tempeh. 2 Tbsp hummus. Dairy An individual container of yogurt (6-8 oz). 1 piece of cheese the size of your thumb (1 oz). 1 cup (8 oz) milk or milk alternative. Fat A piece the size of one dice. 1 tsp soft margarine. 1 Tbsp mayonnaise. 1 tsp vegetable oil. 1 Tbsp regular salad dressing. 2 Tbsp low-fat salad dressing. How many servings should I eat from each food group each day? The following are the suggested number of servings to try and have every day from each food group. You can also look at your eating throughout the week and aim for meeting these requirements on most days for overall healthy eating. Grain 6-8 servings. Try to have half of your grains from whole grains, such as whole wheat bread, corn tortillas, oatmeal, brown rice, whole wheat pasta, and bulgur. Vegetable  At least 2-3 servings. Fruit 2 servings. Meat and Other Protein Foods 5-6 servings. Aim to have lean proteins, such as chicken, [place], fish, beans, or tofu. Dairy 3 servings. Choose low-fat or nonfat if you are trying to control your weight. Fat 2-3 servings. Is a serving the same thing as a portion? No. A portion is the actual amount you eat, which may be more than one serving. Knowing the specific serving size of a food and the nutritional information that goes with it can help you make a healthy decision on what size portion to eat. What are some tips to help me learn healthy serving sizes?  Check food labels for serving sizes. Many foods that come as a single portion actually contain multiple servings.  Determine the serving size of foods you commonly eat and figure out how large a portion you usually eat.  Measure the number of servings that can be held by the bowls, glasses, cups, and plates you typically use. For example, pour your breakfast cereal into your regular bowl  and then pour it into a measuring cup.  For 1-2 days, measure the serving sizes of all the foods you eat.  Practice estimating serving sizes and determining how big your portions should be. This information is not intended to replace advice given to you by your health care provider. Make sure you discuss any questions you have with your health care provider. Document Released: 06/07/2003 Document Revised: 05/03/2016 Document Reviewed: 12/06/2013 Elsevier Interactive Patient Education  2018 [employer].  Obesity, Adult Obesity is having too much body fat. If you have a BMI of 30 or more, you are obese. BMI is a number that explains how much body fat you have. Obesity is often caused by taking in (consuming) more calories than your body uses. Obesity can cause serious health problems. Changing your lifestyle can help to treat obesity. Follow these instructions at home: Eating and drinking   Follow advice from your doctor about what to eat and drink. Your doctor may tell you to: ? Cut down on (limit) fast foods, sweets, and processed snack foods. ? Choose low-fat options. For example, choose low-fat milk instead of whole milk. ? Eat 5 or more servings of fruits or vegetables every day. ? Eat at home more often. This gives you more control over what you eat. ? Choose healthy foods when you eat out. ? Learn what a healthy portion size is. A portion size is the amount of a certain food that is healthy for you to eat at one time. This is different for each person. ? Keep low-fat snacks available. ? Avoid sugary drinks. These include soda, fruit juice, iced tea that is sweetened with sugar, and flavored milk. ? Eat a healthy breakfast.  Drink enough water to keep your pee (urine) clear or pale yellow.  Do not go without eating for long periods of time (do not fast).  Do not go on popular or trendy diets (fad diets). Physical Activity  Exercise often, as told by your doctor. Ask your  doctor: ? What types of exercise are safe for you. ? How often you should exercise.  Warm up and stretch before being active.  Do slow stretching after being active (cool down).  Rest between times of being active. Lifestyle  Limit how much time you spend in front of your TV, computer, or video game system (be less sedentary).  Find ways to reward yourself that do not involve food.  Limit alcohol intake  to no more than 1 drink a day for nonpregnant women and 2 drinks a day for men. One drink equals 12 oz of beer, 5 oz of wine, or 1 oz of hard liquor. General instructions  Keep a weight loss journal. This can help you keep track of: ? The food that you eat. ? The exercise that you do.  Take over-the-counter and prescription medicines only as told by your doctor.  Take vitamins and supplements only as told by your doctor.  Think about joining a support group. Your doctor may be able to help with this.  Keep all follow-up visits as told by your doctor. This is important. Contact a doctor if:  You cannot meet your weight loss goal after you have changed your diet and lifestyle for 6 weeks. This information is not intended to replace advice given to you by your health care provider. Make sure you discuss any questions you have with your health care provider. Document Released: 12/01/2011 Document Revised: 02/14/2016 Document Reviewed: 06/27/2015 Elsevier Interactive Patient Education  2019 [employer].

## 2018-09-09 NOTE — Patient Instructions (Signed)
Carrie Figueroa , Thank you for taking time to come for your Medicare Wellness Visit. I appreciate your ongoing commitment to your health goals. Please review the following plan we discussed and let me know if I can assist you in the future.   Screening recommendations/referrals: Colonoscopy: 02/2015 Mammogram: 06/2018 Bone Density: stated had Recommended yearly ophthalmology/optometry visit for glaucoma screening and checkup Recommended yearly dental visit for hygiene and checkup  Vaccinations: Influenza vaccine: declined at this time Pneumococcal vaccine: 08/2017 Tdap vaccine: 11/2013 Shingles vaccine: discussed    Advanced directives: Advance directive discussed with you today. I have provided a copy for you to complete at home and have notarized. Once this is complete please bring a copy in to our office so we can scan it into your chart.   Conditions/risks identified: obesity  Next appointment: 03/11/2019 at 9:15   Preventive Care 65 Years and Older, Female Preventive care refers to lifestyle choices and visits with your health care provider that can promote health and wellness. What does preventive care include?  A yearly physical exam. This is also called an annual well check.  Dental exams once or twice a year.  Routine eye exams. Ask your health care provider how often you should have your eyes checked.  Personal lifestyle choices, including:  Daily care of your teeth and gums.  Regular physical activity.  Eating a healthy diet.  Avoiding tobacco and drug use.  Limiting alcohol use.  Practicing safe sex.  Taking low-dose aspirin every day.  Taking vitamin and mineral supplements as recommended by your health care provider. What happens during an annual well check? The services and screenings done by your health care provider during your annual well check will depend on your age, overall health, lifestyle risk factors, and family history of disease. Counseling    Your health care provider may ask you questions about your:  Alcohol use.  Tobacco use.  Drug use.  Emotional well-being.  Home and relationship well-being.  Sexual activity.  Eating habits.  History of falls.  Memory and ability to understand (cognition).  Work and work Statistician.  Reproductive health. Screening  You may have the following tests or measurements:  Height, weight, and BMI.  Blood pressure.  Lipid and cholesterol levels. These may be checked every 5 years, or more frequently if you are over 71 years old.  Skin check.  Lung cancer screening. You may have this screening every year starting at age 53 if you have a 30-pack-year history of smoking and currently smoke or have quit within the past 15 years.  Fecal occult blood test (FOBT) of the stool. You may have this test every year starting at age 81.  Flexible sigmoidoscopy or colonoscopy. You may have a sigmoidoscopy every 5 years or a colonoscopy every 10 years starting at age 61.  Hepatitis C blood test.  Hepatitis B blood test.  Sexually transmitted disease (STD) testing.  Diabetes screening. This is done by checking your blood sugar (glucose) after you have not eaten for a while (fasting). You may have this done every 1-3 years.  Bone density scan. This is done to screen for osteoporosis. You may have this done starting at age 12.  Mammogram. This may be done every 1-2 years. Talk to your health care provider about how often you should have regular mammograms. Talk with your health care provider about your test results, treatment options, and if necessary, the need for more tests. Vaccines  Your health care provider may recommend  certain vaccines, such as:  Influenza vaccine. This is recommended every year.  Tetanus, diphtheria, and acellular pertussis (Tdap, Td) vaccine. You may need a Td booster every 10 years.  Zoster vaccine. You may need this after age 25.  Pneumococcal 13-valent  conjugate (PCV13) vaccine. One dose is recommended after age 39.  Pneumococcal polysaccharide (PPSV23) vaccine. One dose is recommended after age 87. Talk to your health care provider about which screenings and vaccines you need and how often you need them. This information is not intended to replace advice given to you by your health care provider. Make sure you discuss any questions you have with your health care provider. Document Released: 10/05/2015 Document Revised: 05/28/2016 Document Reviewed: 07/10/2015 Elsevier Interactive Patient Education  2017 [place] Prevention in the Home Falls can cause injuries. They can happen to people of all ages. There are many things you can do to make your home safe and to help prevent falls. What can I do on the outside of my home?  Regularly fix the edges of walkways and driveways and fix any cracks.  Remove anything that might make you trip as you walk through a door, such as a raised step or threshold.  Trim any bushes or trees on the path to your home.  Use bright outdoor lighting.  Clear any walking paths of anything that might make someone trip, such as rocks or tools.  Regularly check to see if handrails are loose or broken. Make sure that both sides of any steps have handrails.  Any raised decks and porches should have guardrails on the edges.  Have any leaves, snow, or ice cleared regularly.  Use sand or salt on walking paths during winter.  Clean up any spills in your garage right away. This includes oil or grease spills. What can I do in the bathroom?  Use night lights.  Install grab bars by the toilet and in the tub and shower. Do not use towel bars as grab bars.  Use non-skid mats or decals in the tub or shower.  If you need to sit down in the shower, use a plastic, non-slip stool.  Keep the floor dry. Clean up any water that spills on the floor as soon as it happens.  Remove soap buildup in the tub or  shower regularly.  Attach bath mats securely with double-sided non-slip rug tape.  Do not have throw rugs and other things on the floor that can make you trip. What can I do in the bedroom?  Use night lights.  Make sure that you have a light by your bed that is easy to reach.  Do not use any sheets or blankets that are too big for your bed. They should not hang down onto the floor.  Have a firm chair that has side arms. You can use this for support while you get dressed.  Do not have throw rugs and other things on the floor that can make you trip. What can I do in the kitchen?  Clean up any spills right away.  Avoid walking on wet floors.  Keep items that you use a lot in easy-to-reach places.  If you need to reach something above you, use a strong step stool that has a grab bar.  Keep electrical cords out of the way.  Do not use floor polish or wax that makes floors slippery. If you must use wax, use non-skid floor wax.  Do not have throw rugs and other  things on the floor that can make you trip. What can I do with my stairs?  Do not leave any items on the stairs.  Make sure that there are handrails on both sides of the stairs and use them. Fix handrails that are broken or loose. Make sure that handrails are as long as the stairways.  Check any carpeting to make sure that it is firmly attached to the stairs. Fix any carpet that is loose or worn.  Avoid having throw rugs at the top or bottom of the stairs. If you do have throw rugs, attach them to the floor with carpet tape.  Make sure that you have a light switch at the top of the stairs and the bottom of the stairs. If you do not have them, ask someone to add them for you. What else can I do to help prevent falls?  Wear shoes that:  Do not have high heels.  Have rubber bottoms.  Are comfortable and fit you well.  Are closed at the toe. Do not wear sandals.  If you use a stepladder:  Make sure that it is fully  opened. Do not climb a closed stepladder.  Make sure that both sides of the stepladder are locked into place.  Ask someone to hold it for you, if possible.  Clearly mark and make sure that you can see:  Any grab bars or handrails.  First and last steps.  Where the edge of each step is.  Use tools that help you move around (mobility aids) if they are needed. These include:  Canes.  Walkers.  Scooters.  Crutches.  Turn on the lights when you go into a dark area. Replace any light bulbs as soon as they burn out.  Set up your furniture so you have a clear path. Avoid moving your furniture around.  If any of your floors are uneven, fix them.  If there are any pets around you, be aware of where they are.  Review your medicines with your doctor. Some medicines can make you feel dizzy. This can increase your chance of falling. Ask your doctor what other things that you can do to help prevent falls. This information is not intended to replace advice given to you by your health care provider. Make sure you discuss any questions you have with your health care provider. Document Released: 07/05/2009 Document Revised: 02/14/2016 Document Reviewed: 10/13/2014 Elsevier Interactive Patient Education  2017 [employer].   Exercise for Older Adults Staying physically active is important as you age. The four types of exercises that are best for older adults are endurance, strength, balance, and flexibility. Contact your health care provider before you start any exercise routine. Ask your health care provider what activities are safe for you. What are the risks? Risks associated with exercising include:  Overdoing it. This may lead to sore muscles or fatigue.  Falls.  Injuries.  Dehydration. How to do these exercises Endurance exercises Endurance (aerobic) exercises raise your breathing rate and heart rate. Increasing your endurance helps you to do everyday tasks and stay healthy.  By improving the health of your body system that includes your heart, lungs, and blood vessels (circulatory system), you may also delay or prevent diseases such as heart disease, diabetes, and bone loss (osteoporosis). Types of endurance exercises include:  Sports.  Indoor activities, such as using gym equipment, doing water aerobics, or dancing.  Outdoor activities, such as biking or jogging.  Tasks around the house, such as  gardening, yard work, and heavy household chores like cleaning.  Walking, such as hiking or walking around your neighborhood. When doing endurance exercises, make sure you:  Are aware of your surroundings.  Use safety equipment as directed.  Dress in layers when exercising outdoors.  Drink plenty of water to stay well hydrated. Build up endurance slowly. Start with 10 minutes at a time, and gradually build up to doing 30 minutes at a time. Unless your health care provider gave you different instructions, aim to exercise for a total of 150 minutes a week. Spread out that time so you are working on endurance on 3 or more days a week. Strength exercises Lifting, pulling, or pushing weights helps to strengthen muscles. Having stronger muscles makes it easier to do everyday activities, such as getting up from a chair, climbing stairs, carrying groceries, and playing with grandchildren. Strength exercises include arm and leg exercises that may be done:  With weights.  Without weights (using your own body weight).  With a resistance band. When doing strength exercises:  Move smoothly and steadily. Do not suddenly thrust or jerk the weights, the resistance band, or your body.  Start with no weights or with light weights, and gradually add more weight over time. Eventually, aim to use weights that are hard or very hard for you to lift. This means that you are able to do 8 repetitions with the weight, and the last few repetitions are very challenging.  Lift or push  weights into position for 3 seconds, hold the position for 1 second, and then take 3 seconds to return to your starting position.  Breathe out (exhale) during difficult movements, like lifting or pushing weights. Breathe in (inhale) to relax your muscles before the next repetition.  Consider alternating arms or legs, especially when you first start strength exercises.  Expect some slight muscle soreness after each session. Do strength exercises on 2 or more days a week, for 30 minutes at a time. Avoid exercising the same muscle groups two days in a row. For example, if you work on your leg muscles one day, work on your arm muscles the next day. When you can do two sets of 10-15 repetitions with a certain weight, increase the amount of weight. Balance Balance exercises can help to prevent falls. Balance exercises include:  Standing on one foot.  Heel-to-toe walk.  Balance walk.  Tai chi. Make sure you have something sturdy to hold onto while doing balance exercises, such as a sturdy chair. As your balance improves, challenge yourself by holding onto the chair with one hand instead of two, and then with no hands. Trying exercises with your eyes closed also challenges your balance, but be sure to have a sturdy surface (like a countertop) close by in case you need it. Do balance exercises as often as you want, or as often as directed by your health care provider. Strength exercises for the lower body also help to improve balance. Flexibility Flexibility exercises improve how far you can bend, straighten, move, or rotate parts of your body (range of motion). These exercises also help you to do everyday activities such as getting dressed or reaching for objects. Flexibility exercises include stretching different parts of the body, and they may be done in a standing or seated position or on the floor. When stretching, make sure you:  Keep a slight bend in your arms and legs. Avoid completely  straightening ("locking") your joints.  Do not stretch so far that  you feel pain. You should feel a mild stretching feeling. You may try stretching farther as you become more flexible over time.  Relax and breathe between stretches.  Hold onto something sturdy for balance as needed. Hold each stretch for 10-30 seconds. Repeat each stretch 3-5 times. General safety tips  Exercise in well-lit areas.  Do not hold your breath during exercises or stretches.  Warm up before exercising, and cool down after exercising. This can help prevent injury.  Drink plenty of water during exercise or any activity that makes you sweat.  Use smooth, steady movements. Do not use sudden, jerking movements, especially when lifting weights or doing flexibility exercises.  If you are not sure if an exercise is safe for you, or you are not sure how to do an exercise, talk with your health care provider. This is especially important if you have had surgery on muscles, bones, or joints (orthopedic surgery). Where to find more information You can find more information about exercise for older adults from:  Your local health department, fitness center, or community center. These facilities may have programs for aging adults.  [employer] on Aging: http://kim-miller.com/  National Council on Aging: www.ncoa.org Summary  Staying physically active is important as you age.  Make sure to contact your health care provider before you start any exercise routine. Ask your health care provider what activities are safe for you.  Doing endurance, strength, balance, and flexibility exercises can help to delay or prevent certain diseases, such as heart disease, diabetes, and bone loss (osteoporosis). This information is not intended to replace advice given to you by your health care provider. Make sure you discuss any questions you have with your health care provider. Document Released: 01/28/2017 Document Revised:  01/28/2017 Document Reviewed: 01/28/2017 Elsevier Interactive Patient Education  2019 [employer].

## 2018-09-09 NOTE — Progress Notes (Signed)
Subjective:     Patient ID: Carrie Figueroa , female    DOB: Mar 31, 1946 , 72 y.o.   MRN: [id number]   Chief Complaint  Patient presents with  . Hypertension    HPI  Hypertension  This is a chronic problem. The current episode started more than 1 year ago. The problem has been gradually worsening since onset. The problem is uncontrolled. Pertinent negatives include no anxiety, blurred vision, chest pain, headaches, malaise/fatigue, palpitations or shortness of breath. There are no associated agents to hypertension. Risk factors for coronary artery disease include obesity and sedentary lifestyle. Past treatments include nothing. The current treatment provides no improvement. There are no compliance problems.  There is no history of angina. There is no history of chronic renal disease.     Past Medical History:  Diagnosis Date  . Hypertension      Family History  Problem Relation Age of Onset  . Heart disease Mother        CHF  . Diabetes Mother   . Heart disease Father   . Diabetes Father   . Breast cancer Maternal Grandmother   . Breast cancer Sister      Current Outpatient Medications:  .  albuterol (PROAIR HFA) 108 (90 BASE) MCG/ACT inhaler, Inhale 2 puffs into the lungs every 6 (six) hours as needed for wheezing or shortness of breath., Disp: 1 Inhaler, Rfl: 3 .  Ascorbic Acid (VITAMIN C) 1000 MG tablet, Take 1,000 mg by mouth 2 (two) times a week., Disp: , Rfl:  .  aspirin EC 81 MG tablet, Take 81 mg by mouth daily., Disp: , Rfl:  .  Cyanocobalamin (VITAMIN B 12 PO), Take 1 tablet by mouth 2 (two) times a week., Disp: , Rfl:  .  Fluticasone-Salmeterol (ADVAIR DISKUS) 100-50 MCG/DOSE AEPB, Inhale 1 puff into the lungs 2 (two) times daily., Disp: 60 each, Rfl: 5 .  Multiple Vitamin (MULTIVITAMIN WITH MINERALS) TABS tablet, Take 2 tablets by mouth daily., Disp: , Rfl:  .  nebivolol (BYSTOLIC) 5 MG tablet, Take 5 mg by mouth daily., Disp: , Rfl:  .  Olmesartan-Amlodipine-HCTZ  (TRIBENZOR) 40-10-25 MG TABS, Take 1 tablet by mouth daily., Disp: , Rfl:    No Known Allergies   Review of Systems  Constitutional: Negative.  Negative for fatigue and malaise/fatigue.  Eyes: Negative for blurred vision and visual disturbance.  Respiratory: Negative.  Negative for cough and shortness of breath.   Cardiovascular: Negative.  Negative for chest pain, palpitations and leg swelling.  Gastrointestinal: Negative.   Endocrine: Negative.  Negative for polydipsia, polyphagia and polyuria.  Musculoskeletal: Negative.   Skin: Negative.   Neurological: Negative for dizziness, weakness and headaches.  Psychiatric/Behavioral: Negative for confusion. The patient is not nervous/anxious.      Today's Vitals   09/09/18 0945  BP: (!) 142/76  Pulse: 60  Temp: 98.3 F (36.8 C)  Weight: 266 lb (120.7 kg)  Height: 5' 1.4" (1.56 m)  PainSc: 2    Body mass index is 49.61 kg/m.   Objective:  Physical Exam Vitals signs reviewed.  Constitutional:      Appearance: She is well-developed.  Eyes:     Pupils: Pupils are equal, round, and reactive to light.  Cardiovascular:     Rate and Rhythm: Normal rate and regular rhythm.     Heart sounds: Normal heart sounds. No murmur.  Pulmonary:     Effort: Pulmonary effort is normal.     Breath sounds: Normal breath sounds.  Abdominal:  General: Abdomen is flat.     Palpations: Abdomen is soft.  Musculoskeletal: Normal range of motion.  Skin:    General: Skin is warm and dry.     Capillary Refill: Capillary refill takes less than 2 seconds.  Neurological:     Mental Status: She is alert and oriented to person, place, and time.     Cranial Nerves: No cranial nerve deficit.  Psychiatric:        Mood and Affect: Mood normal.         Assessment And Plan:     1. Routine health maintenance  She had her medicare annual wellness exam today - CBC no Diff  2. Essential hypertension, benign . B/P is controlled.  . CMP ordered to  check renal function.  . The importance of regular exercise and dietary modification was stressed to the patient.  . Stressed importance of losing ten percent of her body weight to help with B/P control.  . The weight loss would help with decreasing cardiac and cancer risk as well.  - CMP14 + Anion Gap - POCT Urinalysis Dipstick (81002) - POCT UA - Microalbumin  3. Morbid obesity (HCC)  Chronic  Discussed healthy diet and regular exercise options   Encouraged to exercise at least 150 minutes per week with 2 days of strength training - CMP14 + Anion Gap - Hemoglobin A1c  4. Dyspnea on exertion  Intermittent shortness of breath  No wheezing noted on exam  Likely related to deconditioning vs weather changing - albuterol (PROAIR HFA) 108 (90 Base) MCG/ACT inhaler; Inhale 2 puffs into the lungs every 6 (six) hours as needed for wheezing or shortness of breath.  Dispense: 1 Inhaler; Refill: 3  5. Prediabetes  Chronic, controlled  No current medications  Encouraged to limit intake of sugary foods and drinks  Encouraged to increase physical activity to 150 minutes per week - Hemoglobin A1c  6. Other long term (current) drug therapy - CMP14 + Anion Gap - CBC no Diff  7. Need for influenza vaccination  Influenza vaccine administered  Encouraged to take Tylenol as needed for fever or muscle aches. - Flu vaccine HIGH DOSE PF (Fluzone High dose)       Minette Brine, FNP

## 2018-09-19 ENCOUNTER — Encounter: Payer: Self-pay | Admitting: Nurse Practitioner

## 2018-10-13 ENCOUNTER — Other Ambulatory Visit: Payer: Self-pay | Admitting: Nurse Practitioner

## 2019-03-01 ENCOUNTER — Telehealth: Payer: Self-pay

## 2019-03-01 ENCOUNTER — Ambulatory Visit: Payer: Medicare Other | Admitting: Nurse Practitioner

## 2019-03-01 NOTE — Telephone Encounter (Signed)
Patient called wanting to reschedule her appointment due to her mother passing away. I have rescheduled appointment Carrie Figueroa

## 2019-03-11 ENCOUNTER — Ambulatory Visit: Payer: Medicare Other | Admitting: Nurse Practitioner

## 2019-03-22 ENCOUNTER — Ambulatory Visit: Payer: Self-pay | Admitting: Nurse Practitioner

## 2019-03-23 ENCOUNTER — Telehealth: Payer: Self-pay

## 2019-03-23 NOTE — Telephone Encounter (Signed)
LVM for pt to call back to reschedule her missed appt 03/23/2019

## 2019-04-20 ENCOUNTER — Other Ambulatory Visit: Payer: Self-pay | Admitting: Nurse Practitioner

## 2019-04-27 ENCOUNTER — Other Ambulatory Visit: Payer: Self-pay | Admitting: Nurse Practitioner

## 2019-04-27 ENCOUNTER — Other Ambulatory Visit: Payer: Self-pay

## 2019-04-27 MED ORDER — NEBIVOLOL HCL 5 MG PO TABS: 5.0000 mg | ORAL_TABLET | Freq: Every day | ORAL | 0 refills | Status: DC

## 2019-04-27 MED ORDER — OLMESARTAN-AMLODIPINE-HCTZ 40-10-25 MG PO TABS: 1.0000 | ORAL_TABLET | Freq: Every day | ORAL | 0 refills | Status: DC

## 2019-05-02 ENCOUNTER — Other Ambulatory Visit: Payer: Self-pay

## 2019-05-03 ENCOUNTER — Encounter: Payer: Self-pay | Admitting: Nurse Practitioner

## 2019-05-03 ENCOUNTER — Ambulatory Visit (INDEPENDENT_AMBULATORY_CARE_PROVIDER_SITE_OTHER): Payer: Medicare Other | Admitting: Nurse Practitioner

## 2019-05-03 VITALS — BP 114/70 | HR 62 | Temp 98.6°F | Ht 61.4 in | Wt 262.8 lb

## 2019-05-03 DIAGNOSIS — E782 Mixed hyperlipidemia: Secondary | ICD-10-CM

## 2019-05-03 DIAGNOSIS — R413 Other amnesia: Secondary | ICD-10-CM

## 2019-05-03 DIAGNOSIS — R7303 Prediabetes: Secondary | ICD-10-CM | POA: Insufficient documentation

## 2019-05-03 DIAGNOSIS — I10 Essential (primary) hypertension: Secondary | ICD-10-CM

## 2019-05-03 NOTE — Progress Notes (Signed)
Subjective:     Patient ID: Carrie Figueroa , female    DOB: May 01, 1946 , 73 y.o.   MRN: [id number]   Chief Complaint  Patient presents with  . Hypertension  . Vitamin b12    HPI  Wt Readings from Last 3 Encounters: 05/03/19 : 262 lb 12.8 oz (119.2 kg) 09/09/18 : 266 lb (120.7 kg) 09/09/18 : 266 lb (120.7 kg)  She is eating more vegetables and trying to transition to vegan.  She had a Life Line Screening which indicated her LDL's were unable to detect.  Hypertension This is a chronic problem. The current episode started more than 1 year ago. The problem is unchanged. The problem is controlled. Pertinent negatives include no anxiety, chest pain, headaches, palpitations, peripheral edema or shortness of breath. There are no associated agents to hypertension. Risk factors for coronary artery disease include obesity and sedentary lifestyle. Past treatments include beta blockers, ACE inhibitors and diuretics. There are no compliance problems.  There is no history of angina or kidney disease. There is no history of chronic renal disease.     Past Medical History:  Diagnosis Date  . Hypertension      Family History  Problem Relation Age of Onset  . Heart disease Mother        CHF  . Diabetes Mother   . COPD Mother   . Hyperthyroidism Mother   . Dementia Mother   . Heart disease Father   . Diabetes Father   . Breast cancer Maternal Grandmother   . Breast cancer Sister      Current Outpatient Medications:  .  albuterol (PROAIR HFA) 108 (90 Base) MCG/ACT inhaler, Inhale 2 puffs into the lungs every 6 (six) hours as needed for wheezing or shortness of breath., Disp: 1 Inhaler, Rfl: 3 .  Ascorbic Acid (VITAMIN C) 1000 MG tablet, Take 1,000 mg by mouth 2 (two) times a week., Disp: , Rfl:  .  aspirin EC 81 MG tablet, Take 81 mg by mouth daily., Disp: , Rfl:  .  B Complex Vitamins (B COMPLEX 100 PO), Take 1 capsule by mouth daily., Disp: , Rfl:  .  Multiple Vitamin (MULTIVITAMIN WITH  MINERALS) TABS tablet, Take 2 tablets by mouth daily., Disp: , Rfl:  .  nebivolol (BYSTOLIC) 5 MG tablet, Take 1 tablet (5 mg total) by mouth daily., Disp: 30 tablet, Rfl: 0 .  Olmesartan-amLODIPine-HCTZ 40-10-25 MG TABS, Take 1 tablet by mouth daily., Disp: 30 tablet, Rfl: 0   No Known Allergies   Review of Systems  Constitutional: Negative.   Respiratory: Negative for cough and shortness of breath.   Cardiovascular: Negative.  Negative for chest pain, palpitations and leg swelling.  Neurological: Negative for dizziness and headaches.  Psychiatric/Behavioral: Negative.      Today's Vitals   05/03/19 1033  BP: 114/70  Pulse: 62  Temp: 98.6 F (37 C)  TempSrc: Oral  Weight: 262 lb 12.8 oz (119.2 kg)  Height: 5' 1.4" (1.56 m)  PainSc: 5   PainLoc: Knee   Body mass index is 49.01 kg/m.   Objective:  Physical Exam Vitals signs reviewed.  Constitutional:      General: She is not in acute distress.    Appearance: Normal appearance. She is obese.  Cardiovascular:     Rate and Rhythm: Normal rate and regular rhythm.     Pulses: Normal pulses.     Heart sounds: Normal heart sounds. No murmur.  Pulmonary:     Effort: Pulmonary effort  is normal. No respiratory distress.     Breath sounds: Normal breath sounds.  Skin:    Capillary Refill: Capillary refill takes less than 2 seconds.  Neurological:     General: No focal deficit present.     Mental Status: She is alert and oriented to person, place, and time.  Psychiatric:        Mood and Affect: Mood normal.        Behavior: Behavior normal.        Thought Content: Thought content normal.        Judgment: Judgment normal.         Assessment And Plan:     1. Essential hypertension, benign . B/P is better controlled.  . CMP ordered to check renal function.  . The importance of regular exercise and dietary modification was stressed to the patient.  . Stressed importance of losing ten percent of her body weight to help  with B/P control.  . The weight loss would help with decreasing cardiac and cancer risk as well.  - CMP14 + Anion Gap  2. Prediabetes Chronic, stable Continue to limit intake of sugary foods and drinks - Hemoglobin A1c  3. Morbid obesity (HCC)  Chronic  Discussed healthy diet and regular exercise options   Encouraged to exercise with chair exercises or walking at least 4-5 times per week  4. Mixed hyperlipidemia  Chronic, controlled  No current medications  Increase your fiber intake - Lipid panel  5. Memory difficulties  Intermittent times of forgetfulness  Will check vitamin B12 today - Vitamin B12    Minette Brine, FNP    THE PATIENT IS ENCOURAGED TO PRACTICE SOCIAL DISTANCING DUE TO THE COVID-19 PANDEMIC.

## 2019-05-04 LAB — HEMOGLOBIN A1C
Est. average glucose Bld gHb Est-mCnc: 111 mg/dL
Hgb A1c MFr Bld: 5.5 % (ref 4.8–5.6)

## 2019-05-04 LAB — LIPID PANEL
Chol/HDL Ratio: 2.7 ratio (ref 0.0–4.4)
Cholesterol, Total: 157 mg/dL (ref 100–199)
HDL: 59 mg/dL (ref >39)
LDL Calculated: 87 mg/dL (ref 0–99)
Triglycerides: 56 mg/dL (ref 0–149)
VLDL Cholesterol Cal: 11 mg/dL (ref 5–40)

## 2019-05-04 LAB — CMP14 + ANION GAP
ALT: 21 IU/L (ref 0–32)
AST: 26 IU/L (ref 0–40)
Albumin/Globulin Ratio: 1.7 (ref 1.2–2.2)
Albumin: 4.2 g/dL (ref 3.7–4.7)
Alkaline Phosphatase: 66 IU/L (ref 39–117)
Anion Gap: 15 mmol/L (ref 10.0–18.0)
BUN/Creatinine Ratio: 19 (ref 12–28)
BUN: 21 mg/dL (ref 8–27)
Bilirubin Total: 0.4 mg/dL (ref 0.0–1.2)
CO2: 25 mmol/L (ref 20–29)
Calcium: 9.6 mg/dL (ref 8.7–10.3)
Chloride: 96 mmol/L (ref 96–106)
Creatinine, Ser: 1.1 mg/dL — ABNORMAL HIGH (ref 0.57–1.00)
GFR calc Af Amer: 58 mL/min/{1.73_m2} — ABNORMAL LOW (ref >59)
GFR calc non Af Amer: 50 mL/min/{1.73_m2} — ABNORMAL LOW (ref >59)
Globulin, Total: 2.5 g/dL (ref 1.5–4.5)
Glucose: 111 mg/dL — ABNORMAL HIGH (ref 65–99)
Potassium: 4.2 mmol/L (ref 3.5–5.2)
Sodium: 136 mmol/L (ref 134–144)
Total Protein: 6.7 g/dL (ref 6.0–8.5)

## 2019-05-04 LAB — VITAMIN B12: Vitamin B-12: 1659 pg/mL — ABNORMAL HIGH (ref 232–1245)

## 2019-05-16 ENCOUNTER — Encounter: Payer: Self-pay | Admitting: Nurse Practitioner

## 2019-05-20 ENCOUNTER — Other Ambulatory Visit: Payer: Self-pay | Admitting: Nurse Practitioner

## 2019-06-17 ENCOUNTER — Telehealth: Payer: Self-pay | Admitting: Nurse Practitioner

## 2019-06-17 NOTE — Chronic Care Management (AMB) (Signed)
°  Chronic Care Management   Outreach Note  06/17/2019 Name: Carrie Figueroa MRN: [id number] DOB: 02-13-1946  Referred by: Minette Brine, FNP Reason for referral : Chronic Care Management (Initial CCM outreach was unsuccessful)   An unsuccessful telephone outreach was attempted today. The patient was referred to the case management team by for assistance with chronic care management and care coordination.   Follow Up Plan: A HIPPA compliant phone message was left for the patient providing contact information and requesting a return call.  The care management team will reach out to the patient again over the next 7 days.  If patient returns call to provider office, please advise to call [place] at [place]  [email or web address]   ??[phone]

## 2019-06-18 ENCOUNTER — Other Ambulatory Visit: Payer: Self-pay | Admitting: Nurse Practitioner

## 2019-06-20 NOTE — Chronic Care Management (AMB) (Signed)
Chronic Care Management   Note  06/20/2019 Name: Carrie Figueroa MRN: [id number] DOB: [birth year]  Carrie Figueroa is a 73 y.o. year old female who is a primary care patient of Minette Brine, [place]. I reached out to [facility] by phone today in response to a referral sent by Carrie Figueroa's patient's health plan.     Carrie Figueroa was given information about Chronic Care Management services today including:  1. CCM service includes personalized support from designated clinical staff supervised by her physician, including individualized plan of care and coordination with other care providers 2. 24/7 contact phone numbers for assistance for urgent and routine care needs. 3. Service will only be billed when office clinical staff spend 20 minutes or more in a month to coordinate care. 4. Only one practitioner may furnish and bill the service in a calendar month. 5. The patient may stop CCM services at any time (effective at the end of the month) by phone call to the office staff. 6. The patient will be responsible for cost sharing (co-pay) of up to 20% of the service fee (after annual deductible is met).  Patient agreed to services and verbal consent obtained.   Follow up plan: Telephone appointment with CCM team member scheduled for:07/27/2019  [place]  [email or web address]   ??[phone]

## 2019-07-13 DIAGNOSIS — Z23 Encounter for immunization: Secondary | ICD-10-CM | POA: Diagnosis not present

## 2019-07-16 ENCOUNTER — Other Ambulatory Visit: Payer: Self-pay | Admitting: Nurse Practitioner

## 2019-07-25 ENCOUNTER — Ambulatory Visit (INDEPENDENT_AMBULATORY_CARE_PROVIDER_SITE_OTHER): Payer: Medicare Other | Admitting: Nurse Practitioner

## 2019-07-25 ENCOUNTER — Other Ambulatory Visit: Payer: Self-pay

## 2019-07-25 ENCOUNTER — Encounter: Payer: Self-pay | Admitting: Nurse Practitioner

## 2019-07-25 VITALS — BP 122/68 | HR 64 | Temp 98.1°F | Ht 60.8 in | Wt 249.8 lb

## 2019-07-25 DIAGNOSIS — R7303 Prediabetes: Secondary | ICD-10-CM | POA: Diagnosis not present

## 2019-07-25 DIAGNOSIS — Z1159 Encounter for screening for other viral diseases: Secondary | ICD-10-CM | POA: Diagnosis not present

## 2019-07-25 DIAGNOSIS — R0609 Other forms of dyspnea: Secondary | ICD-10-CM

## 2019-07-25 DIAGNOSIS — R0600 Dyspnea, unspecified: Secondary | ICD-10-CM | POA: Diagnosis not present

## 2019-07-25 DIAGNOSIS — H269 Unspecified cataract: Secondary | ICD-10-CM | POA: Diagnosis not present

## 2019-07-25 DIAGNOSIS — Z23 Encounter for immunization: Secondary | ICD-10-CM

## 2019-07-25 DIAGNOSIS — R443 Hallucinations, unspecified: Secondary | ICD-10-CM

## 2019-07-25 MED ORDER — ALBUTEROL SULFATE HFA 108 (90 BASE) MCG/ACT IN AERS: 2.0000 | INHALATION_SPRAY | Freq: Four times a day (QID) | RESPIRATORY_TRACT | 1 refills | Status: DC | PRN

## 2019-07-25 MED ORDER — PNEUMOCOCCAL 13-VAL CONJ VACC IM SUSP: 0.5000 mL | INTRAMUSCULAR | 0 refills | Status: AC

## 2019-07-25 NOTE — Progress Notes (Signed)
Subjective:     Patient ID: Carrie Figueroa , female    DOB: 12-Jul-1946 , 73 y.o.   MRN: [id number]   Chief Complaint  Patient presents with  . Referral    HPI  She is here today requesting a referral to a neurologist due to having hallucinations (ants, tigers, people standing in her house and flys).  She reports beginning months ago having ants and flys - seen in different places for at least 6 months.  She reports when her mother passed after she had not seen her mother for 5 weeks.  She had a head injury in 2013.  She was seen at the eye doctor less than a year.  She was seen by Darlys Gales [facility]) - she would like to be referred to have cataracts done here in [place].  Denies taking any new medications.  She does have intermittent blurred vision. Occasionally will have imbalance.  Denies history of long term alcohol use.      Past Medical History:  Diagnosis Date  . Hypertension      Family History  Problem Relation Age of Onset  . Heart disease Mother        CHF  . Diabetes Mother   . COPD Mother   . Hyperthyroidism Mother   . Dementia Mother   . Heart disease Father   . Diabetes Father   . Breast cancer Maternal Grandmother   . Breast cancer Sister      Current Outpatient Medications:  .  Ascorbic Acid (VITAMIN C) 1000 MG tablet, Take 1,000 mg by mouth 2 (two) times a week., Disp: , Rfl:  .  aspirin EC 81 MG tablet, Take 81 mg by mouth daily., Disp: , Rfl:  .  B Complex Vitamins (B COMPLEX 100 PO), Take 1 capsule by mouth daily., Disp: , Rfl:  .  BYSTOLIC 5 MG tablet, TAKE 1 TABLET BY MOUTH EVERY DAY, Disp: 30 tablet, Rfl: 0 .  Multiple Vitamin (MULTIVITAMIN WITH MINERALS) TABS tablet, Take 2 tablets by mouth daily., Disp: , Rfl:  .  Olmesartan-amLODIPine-HCTZ 40-10-25 MG TABS, TAKE 1 TABLET BY MOUTH EVERY DAY, Disp: 30 tablet, Rfl: 0 .  albuterol (PROAIR HFA) 108 (90 Base) MCG/ACT inhaler, Inhale 2 puffs into the lungs every 6 (six) hours as needed for wheezing or  shortness of breath. (Patient not taking: Reported on 07/25/2019), Disp: 1 Inhaler, Rfl: 3   No Known Allergies   Review of Systems  Constitutional: Negative.   Eyes: Negative for photophobia.  Respiratory: Negative.   Cardiovascular: Negative.  Negative for chest pain, palpitations and leg swelling.  Neurological: Negative for dizziness and headaches.  Psychiatric/Behavioral: Negative.      Today's Vitals   07/25/19 1425  BP: 122/68  Pulse: 64  Temp: 98.1 F (36.7 C)  TempSrc: Oral  Weight: 249 lb 12.8 oz (113.3 kg)  Height: 5' 0.8" (1.544 m)  PainSc: 0-No pain   Body mass index is 47.51 kg/m.   Objective:  Physical Exam Eyes:     Pupils: Pupils are equal, round, and reactive to light.  Cardiovascular:     Pulses: Normal pulses.     Heart sounds: Normal heart sounds. No murmur.  Pulmonary:     Effort: Pulmonary effort is normal. No respiratory distress.     Breath sounds: Normal breath sounds.  Skin:    Capillary Refill: Capillary refill takes less than 2 seconds.  Neurological:     General: No focal deficit present.  Mental Status: She is oriented to person, place, and time.     Coordination: Romberg sign positive. Coordination normal.  Psychiatric:        Mood and Affect: Mood normal.        Behavior: Behavior normal.        Thought Content: Thought content normal.        Judgment: Judgment normal.         Assessment And Plan:     1. Hallucinations  Will check t palladium to ensure not latent syphyllis  I will also refer to Neurology for further evaluation  She has a slight positive romberg.  - T pallidum Screening [place] - Ambulatory referral to Neurology  2. Encounter for immunization  Sent Rx to pharmacy - Pneumococcal conjugate vaccine 13-valent IM  2. Encounter for hepatitis C screening test for low risk patient  Will check for Hepatitis C screening due to being born between the years 20-1965 - Hepatitis C antibody  4.  Prediabetes  Needs a new referral due to her current ophthalmologist does her cataract surgery.   - Ambulatory referral to Ophthalmology  5. Dyspnea on exertion  Intermittent shortness of breath and has not had an inhaler  Rx sent to pharmacy - albuterol (PROAIR HFA) 108 (90 Base) MCG/ACT inhaler; Inhale 2 puffs into the lungs every 6 (six) hours as needed for wheezing or shortness of breath.  Dispense: 18 g; Refill: 1   Minette Brine, FNP    THE PATIENT IS ENCOURAGED TO PRACTICE SOCIAL DISTANCING DUE TO THE COVID-19 PANDEMIC.

## 2019-07-26 LAB — HEPATITIS C ANTIBODY: Hep C Virus Ab: 0.1 s/co ratio (ref 0.0–0.9)

## 2019-07-26 LAB — T PALLIDUM SCREENING CASCADE: T pallidum Antibodies (TP-PA): NONREACTIVE

## 2019-07-27 ENCOUNTER — Telehealth: Payer: Medicare Other

## 2019-08-01 ENCOUNTER — Encounter: Payer: Self-pay | Admitting: Nurse Practitioner

## 2019-08-02 ENCOUNTER — Other Ambulatory Visit: Payer: Self-pay

## 2019-08-02 ENCOUNTER — Encounter: Payer: Self-pay | Admitting: Neurology

## 2019-08-02 ENCOUNTER — Ambulatory Visit (INDEPENDENT_AMBULATORY_CARE_PROVIDER_SITE_OTHER): Payer: Medicare Other | Admitting: Neurology

## 2019-08-02 VITALS — BP 121/78 | HR 64 | Temp 97.3°F | Ht 62.5 in | Wt 252.5 lb

## 2019-08-02 DIAGNOSIS — R441 Visual hallucinations: Secondary | ICD-10-CM | POA: Diagnosis not present

## 2019-08-02 DIAGNOSIS — F419 Anxiety disorder, unspecified: Secondary | ICD-10-CM | POA: Diagnosis not present

## 2019-08-02 DIAGNOSIS — H269 Unspecified cataract: Secondary | ICD-10-CM

## 2019-08-02 DIAGNOSIS — R413 Other amnesia: Secondary | ICD-10-CM

## 2019-08-02 DIAGNOSIS — F329 Major depressive disorder, single episode, unspecified: Secondary | ICD-10-CM | POA: Diagnosis not present

## 2019-08-02 DIAGNOSIS — F32A Depression, unspecified: Secondary | ICD-10-CM

## 2019-08-02 MED ORDER — ARIPIPRAZOLE 2 MG PO TABS: 2.0000 mg | ORAL_TABLET | Freq: Every day | ORAL | 3 refills | Status: DC

## 2019-08-02 NOTE — Progress Notes (Signed)
[facility]  PATIENT: Melma Lazor DOB: 08-11-1946  REFERRING DOCTOR OR PCP: Minette Brine, FNP SOURCE: Patient, notes from primary care, lab reports.  _________________________________   HISTORICAL  CHIEF COMPLAINT:  Chief Complaint  Patient presents with   New Patient (Initial Visit)    RM 13, alone. Internal referral for hallucinations. This started in the last 3 weeks. Months ago, she was seeing flies and ants/spiders. She also saw tigers in dining area.  Most occur when she wakes up from sleeping. She saw lady standing in her dining area. Did MOCA today. 23/30.     HISTORY OF PRESENT ILLNESS:  I had the pleasure of seeing patient, Amellia Bamberger, at [facility] for neurologic consultation regarding her visual hallucinations.  She is a 73 year old woman who has had visual hallucinations for several months.   Initially, she would see ants and flies.    Now she sees animals a lot.   She saw a tiger on the table and mistook a pressure cooker cord for a snake.   She has seen a woman after one nap and   These seem very real to her and the tiger and the snake were scary for her.    Some have occurred when she opens her eyes after a nap or when laying down in bed.    Often she will see spiders in the cracks of the roof when she tries to fall asleep.     She has cataracts and surgery was recommended.     In April, she lost her mother who dies in isolation at a nursing home and seeing her for 5 weeks was very upsetting to her. She was seeing her daily for months before in the nursing home.   Her mom had dementia.    She could barely talk to others at the funeral due to social distancing.   She has been more isolated due to Covid-19.       She has not had any known episodes of REM behavior disorder.    She does not sleep well as neighbors are loud.   She had a PSG several years ago and was told she did not have OSA though an earlier one reportedly did.    PSG 04/28/2017 showed rapid sleep onset of 3 minutes, AHI = 1.6 (10 in REM), 89% sleep efficiency.  There was no stage N3 sleep..  She had soft snoring.   PLMSI was 6.4 but associated arousal index was only 0.8 recent blood work shows normal vitamin B12 and T. pallidum screening.  She is walking well and has no recent falls (fell in 2014 and hit head on linoleum floor).     Balance is slightly worse than a couple years ago but no much worse.    Her medications are only a BP combination (Olmesartan-amlodipine-HCTZ), ASA and vitamins.     Montreal Cognitive Assessment  08/02/2019  Visuospatial/ Executive (0/5) 4  Naming (0/3) 3  Attention: Read list of digits (0/2) 2  Attention: Read list of letters (0/1) 1  Attention: Serial 7 subtraction starting at 100 (0/3) 1  Language: Repeat phrase (0/2) 2  Language : Fluency (0/1) 0  Abstraction (0/2) 1  Delayed Recall (0/5) 3  Orientation (0/6) 6  Total 23  Adjusted Score (based on education) 24       REVIEW OF SYSTEMS: Constitutional: No fevers, chills, sweats, or change in appetite Eyes: No visual changes, double vision, eye pain Ear, nose and  throat: No hearing loss, ear pain, nasal congestion, sore throat Cardiovascular: No chest pain, palpitations Respiratory: No shortness of breath at rest or with exertion.   No wheezes GastrointestinaI: No nausea, vomiting, diarrhea, abdominal pain, fecal incontinence Genitourinary: No dysuria, urinary retention or frequency.  No nocturia. Musculoskeletal: No neck pain, back pain Integumentary: No rash, pruritus, skin lesions Neurological: as above Psychiatric: No depression at this time.  No anxiety Endocrine: No palpitations, diaphoresis, change in appetite, change in weigh or increased thirst Hematologic/Lymphatic: No anemia, purpura, petechiae. Allergic/Immunologic: No itchy/runny eyes, nasal congestion, recent allergic reactions, rashes  ALLERGIES: No Known Allergies  HOME  MEDICATIONS:  Current Outpatient Medications:    albuterol (PROAIR HFA) 108 (90 Base) MCG/ACT inhaler, Inhale 2 puffs into the lungs every 6 (six) hours as needed for wheezing or shortness of breath., Disp: 18 g, Rfl: 1   Ascorbic Acid (VITAMIN C) 1000 MG tablet, Take 1,000 mg by mouth 2 (two) times a week., Disp: , Rfl:    aspirin EC 81 MG tablet, Take 81 mg by mouth daily., Disp: , Rfl:    B Complex Vitamins (B COMPLEX 100 PO), Take 1 capsule by mouth daily., Disp: , Rfl:    BYSTOLIC 5 MG tablet, TAKE 1 TABLET BY MOUTH EVERY DAY, Disp: 30 tablet, Rfl: 0   Multiple Vitamin (MULTIVITAMIN WITH MINERALS) TABS tablet, Take 2 tablets by mouth daily., Disp: , Rfl:    Olmesartan-amLODIPine-HCTZ 40-10-25 MG TABS, TAKE 1 TABLET BY MOUTH EVERY DAY, Disp: 30 tablet, Rfl: 0   ARIPiprazole (ABILIFY) 2 MG tablet, Take 1 tablet (2 mg total) by mouth daily., Disp: 30 tablet, Rfl: 3  PAST MEDICAL HISTORY: Past Medical History:  Diagnosis Date   Hypertension     PAST SURGICAL HISTORY: Past Surgical History:  Procedure Laterality Date   BREAST CYST ASPIRATION  11/10/2013   BUNIONECTOMY     tubal plasty  1974    FAMILY HISTORY: Family History  Problem Relation Age of Onset   Heart disease Mother        CHF   Diabetes Mother    COPD Mother    Hyperthyroidism Mother    Dementia Mother    Heart disease Father    Diabetes Father    Breast cancer Maternal Grandmother    Breast cancer Sister     SOCIAL HISTORY:  Social History   Socioeconomic History   Marital status: Single    Spouse name: Not on file   Number of children: Not on file   Years of education: Not on file   Highest education level: Not on file  Occupational History   Occupation: Med Engineer, production: RETIRED  Social Needs   Financial resource strain: Not hard at all   Food insecurity    Worry: Never true    Inability: Never true   Transportation needs    Medical: No    Non-medical: No   Tobacco Use   Smoking status: Former Smoker    Packs/day: 0.10    Years: 1.00    Pack years: 0.10    Types: Cigarettes   Smokeless tobacco: Never Used  Substance and Sexual Activity   Alcohol use: Yes    Comment: rarely    Drug use: No   Sexual activity: Not Currently  Lifestyle   Physical activity    Days per week: 0 days    Minutes per session: 0 min   Stress: Only a little  Relationships   Social connections  Talks on phone: Not on file    Gets together: Not on file    Attends religious service: Not on file    Active member of club or organization: Not on file    Attends meetings of clubs or organizations: Not on file    Relationship status: Not on file   Intimate partner violence    Fear of current or ex partner: No    Emotionally abused: No    Physically abused: No    Forced sexual activity: No  Other Topics Concern   Not on file  Social History Narrative   Right handed   Lives alone.     Caffeine use: coffee sometimes     PHYSICAL EXAM  Vitals:   08/02/19 0905  BP: 121/78  Pulse: 64  Temp: (!) 97.3 F (36.3 C)  SpO2: 98%  Weight: 252 lb 8 oz (114.5 kg)  Height: 5' 2.5" (1.588 m)    Body mass index is 45.45 kg/m.   General: The patient is well-developed and well-nourished and in no acute distress  HEENT:  Head is [place]/AT.  Sclera are anicteric.  Funduscopic exam shows normal optic discs and retinal vessels.  Neck: No carotid bruits are noted.  The neck is nontender.  Cardiovascular: The heart has a regular rate and rhythm with a normal S1 and S2. There were no murmurs, gallops or rubs.    Skin: Extremities are without rash or  edema.  Musculoskeletal:  Back is nontender  Neurologic Exam  Mental status: The patient is alert and oriented x 3 at the time of the examination.  In conversation, memory and focus appear to be fairly normal.  She did score 24/30 on the [facility] cognitive assessment with details above.   Speech is  normal.  Cranial nerves: Extraocular movements are full. Pupils are equal, round, and reactive to light and accomodation.  Visual fields are full.  Facial symmetry is present. There is good facial sensation to soft touch bilaterally.Facial strength is normal.  Trapezius and sternocleidomastoid strength is normal. No dysarthria is noted.  The tongue is midline, and the patient has symmetric elevation of the soft palate. No obvious hearing deficits are noted.  Motor:  Muscle bulk is normal.   Tone is normal. Strength is  5 / 5 in all 4 extremities.   Sensory: Sensory testing is intact to pinprick, soft touch and vibration sensation in all 4 extremities.  Coordination: Cerebellar testing reveals good finger-nose-finger and heel-to-shin bilaterally.  Gait and station: Station is normal.   Gait is normal. Tandem gait is slightly wide.  She had mild retropulsion.  Romberg is negative.   Reflexes: Deep tendon reflexes are symmetric and normal bilaterally.   Plantar responses are flexor.    DIAGNOSTIC DATA (LABS, IMAGING, TESTING) - I reviewed patient records, labs, notes, testing and imaging myself where available.  Lab Results  Component Value Date   WBC 7.5 09/09/2018   HGB 13.3 09/09/2018   HCT 40.1 09/09/2018   MCV 85 09/09/2018   PLT 338 09/09/2018      Component Value Date/Time   NA 136 05/03/2019 1124   K 4.2 05/03/2019 1124   CL 96 05/03/2019 1124   CO2 25 05/03/2019 1124   GLUCOSE 111 (H) 05/03/2019 1124   GLUCOSE 116 (H) 05/28/2014 2155   BUN 21 05/03/2019 1124   CREATININE 1.10 (H) 05/03/2019 1124   CALCIUM 9.6 05/03/2019 1124   PROT 6.7 05/03/2019 1124   ALBUMIN 4.2 05/03/2019 1124  AST 26 05/03/2019 1124   ALT 21 05/03/2019 1124   ALKPHOS 66 05/03/2019 1124   BILITOT 0.4 05/03/2019 1124   GFRNONAA 50 (L) 05/03/2019 1124   GFRAA 58 (L) 05/03/2019 1124   Lab Results  Component Value Date   CHOL 157 05/03/2019   HDL 59 05/03/2019   LDLCALC 87 05/03/2019   TRIG  56 05/03/2019   CHOLHDL 2.7 05/03/2019   Lab Results  Component Value Date   HGBA1C 5.5 05/03/2019   Lab Results  Component Value Date   VITAMINB12 1,659 (H) 05/03/2019   No results found for: TSH     ASSESSMENT AND PLAN  Formed visual hallucinations  Memory loss - Plan: Thyroid Panel With TSH, MR BRAIN WO CONTRAST  Cataract of both eyes, unspecified cataract type  Anxiety and depression   In summary, Ms. Mcglathery is a 73 year old woman with visual hallucinations that have been progressing over the past few months.  She scored 24/30 on the [facility] cognitive assessment consistent with mild dementia though she appeared to do better in conversation.  The combination of visual hallucinations with mild cognitive impairment would be consistent with Lewy body dementia.  However, she does not have clear-cut parkinsonian features and does not have REM behavior disorder.  I will check an MRI of the brain to determine the atrophy pattern if present and to rule out other processes such as chronic ischemic changes and other processes.  We will check a thyroid function panel as well.  Visual disturbances due to her cataracts could also be playing a role.  Additionally, she has had more anxiety and depression since her mother died earlier this year and that could also be playing some role.  She will return to see [place] in a couple months but sooner if there are new or worsening neurologic symptoms or based on the results of the MRI.  Thank you for asking me to see Ms. Quin.  Please let me know if I can be of further assistance with her or other patients in the future.  Mirakle Tomlin A. Felecia Shelling, MD, [facility] [id number], [id number] PM Certified in Neurology, Clinical Neurophysiology, Sleep Medicine and Neuroimaging  [facility] [address], [place], [place] [address] [phone]

## 2019-08-03 ENCOUNTER — Telehealth: Payer: Self-pay | Admitting: *Deleted

## 2019-08-03 LAB — THYROID PANEL WITH TSH
Free Thyroxine Index: 2.2 (ref 1.2–4.9)
T3 Uptake Ratio: 25 % (ref 24–39)
T4, Total: 8.8 ug/dL (ref 4.5–12.0)
TSH: 1.87 u[IU]/mL (ref 0.450–4.500)

## 2019-08-03 NOTE — Telephone Encounter (Signed)
Spoke with patient and informed her that the lab work is fine. Patient verbalized understanding, appreciation.

## 2019-08-04 ENCOUNTER — Other Ambulatory Visit: Payer: Self-pay | Admitting: Nurse Practitioner

## 2019-08-04 DIAGNOSIS — Z1231 Encounter for screening mammogram for malignant neoplasm of breast: Secondary | ICD-10-CM

## 2019-08-08 ENCOUNTER — Telehealth: Payer: Self-pay

## 2019-08-08 NOTE — Telephone Encounter (Signed)
Patient sent to GI via email [phone]. DW

## 2019-08-13 ENCOUNTER — Other Ambulatory Visit: Payer: Self-pay | Admitting: Nurse Practitioner

## 2019-08-17 DIAGNOSIS — H43811 Vitreous degeneration, right eye: Secondary | ICD-10-CM | POA: Diagnosis not present

## 2019-08-17 DIAGNOSIS — H25813 Combined forms of age-related cataract, bilateral: Secondary | ICD-10-CM | POA: Diagnosis not present

## 2019-08-24 ENCOUNTER — Other Ambulatory Visit: Payer: Self-pay

## 2019-08-24 ENCOUNTER — Other Ambulatory Visit: Payer: Self-pay | Admitting: Neurology

## 2019-08-24 ENCOUNTER — Ambulatory Visit
Admission: RE | Admit: 2019-08-24 | Discharge: 2019-08-24 | Disposition: A | Discharge status: Home / Self Care | Payer: Medicare Other | Source: Ambulatory Visit | Attending: Neurology | Admitting: Neurology

## 2019-08-24 DIAGNOSIS — R413 Other amnesia: Secondary | ICD-10-CM

## 2019-08-25 ENCOUNTER — Telehealth: Payer: Self-pay | Admitting: *Deleted

## 2019-08-25 NOTE — Telephone Encounter (Signed)
-----   Message from Britt Bottom, MD sent at 08/25/2019  9:40 AM EST ----- Please let the patient know that the MRI is fine.   It just shows mild age related changes

## 2019-08-25 NOTE — Telephone Encounter (Signed)
Called and spoke with pt. Relayed results per Dr. Sater note. She verbalized understanding.  

## 2019-09-18 ENCOUNTER — Other Ambulatory Visit: Payer: Self-pay | Admitting: Nurse Practitioner

## 2019-09-21 ENCOUNTER — Ambulatory Visit: Payer: Medicare Other | Admitting: Nurse Practitioner

## 2019-09-21 ENCOUNTER — Ambulatory Visit: Payer: Medicare Other

## 2019-09-21 ENCOUNTER — Telehealth: Payer: Self-pay

## 2019-09-21 NOTE — Telephone Encounter (Signed)
This nurse called patient due to follow up since she did not arrive for scheduled AWV. Patient stated that she forgot all about it. We rescheduled for 09/28/2019 virtually.

## 2019-09-28 ENCOUNTER — Ambulatory Visit (INDEPENDENT_AMBULATORY_CARE_PROVIDER_SITE_OTHER): Payer: Medicare HMO

## 2019-09-28 ENCOUNTER — Other Ambulatory Visit: Payer: Self-pay

## 2019-09-28 VITALS — HR 68 | Temp 98.2°F | Ht 62.5 in | Wt 250.0 lb

## 2019-09-28 DIAGNOSIS — E2839 Other primary ovarian failure: Secondary | ICD-10-CM | POA: Diagnosis not present

## 2019-09-28 DIAGNOSIS — Z0000 Encounter for general adult medical examination without abnormal findings: Secondary | ICD-10-CM | POA: Diagnosis not present

## 2019-09-28 NOTE — Progress Notes (Signed)
This visit type was conducted due to national recommendations for restrictions regarding the COVID-19 Pandemic (e.g. social distancing). This format is felt to be most appropriate for this patient at this time. All issues noted in this document were discussed and addressed. No physical exam was performed (except for noted visual exam findings with Video Visits). The patient, Carrie Figueroa, has given consent to perform this visit via video. Vital signs may be absent or patient reported.   Patient location:  At home   Nurse location:  [facility] office  Subjective:   Carrie Figueroa is a 74 y.o. female who presents for Medicare Annual (Subsequent) preventive examination.  Review of Systems:  n/a Cardiac Risk Factors include: advanced age (>66men, >35 women);hypertension;obesity (BMI >30kg/m2);sedentary lifestyle     Objective:     Vitals: Pulse 68 Comment: per patient  Temp 98.2 F (36.8 C) Comment: per patient  Ht 5' 2.5" (1.588 m) Comment: per patient  Wt 250 lb (113.4 kg) Comment: per patient  SpO2 96% Comment: per patient  BMI 45.00 kg/m   Body mass index is 45 kg/m.  Advanced Directives 09/28/2019 09/09/2018 04/28/2017 05/28/2014  Does Patient Have a Medical Advance Directive? No No No No  Would patient like information on creating a medical advance directive? - Yes (MAU/Ambulatory/Procedural Areas - Information given) No - Patient declined No - patient declined information    Tobacco Social History   Tobacco Use  Smoking Status Former Smoker  . Packs/day: 0.10  . Years: 1.00  . Pack years: 0.10  . Types: Cigarettes  Smokeless Tobacco Never Used     Counseling given: Not Answered   Clinical Intake:  Pre-visit preparation completed: Yes  Pain : No/denies pain     Nutritional Status: BMI > 30  Obese Nutritional Risks: None Diabetes: No  How often do you need to have someone help you when you read instructions, pamphlets, or other written materials from your doctor  or pharmacy?: 1 - Never  Interpreter Needed?: No  Information entered by :: 12th grade  Past Medical History:  Diagnosis Date  . Hypertension    Past Surgical History:  Procedure Laterality Date  . BREAST CYST ASPIRATION  11/10/2013  . BUNIONECTOMY    . tubal plasty  1974   Family History  Problem Relation Age of Onset  . Heart disease Mother        CHF  . Diabetes Mother   . COPD Mother   . Hyperthyroidism Mother   . Dementia Mother   . Heart disease Father   . Diabetes Father   . Breast cancer Maternal Grandmother   . Breast cancer Sister    Social History   Socioeconomic History  . Marital status: Single    Spouse name: Not on file  . Number of children: Not on file  . Years of education: Not on file  . Highest education level: Not on file  Occupational History  . Occupation: Med Engineer, production: RETIRED  Tobacco Use  . Smoking status: Former Smoker    Packs/day: 0.10    Years: 1.00    Pack years: 0.10    Types: Cigarettes  . Smokeless tobacco: Never Used  Substance and Sexual Activity  . Alcohol use: Not Currently  . Drug use: No  . Sexual activity: Not Currently  Other Topics Concern  . Not on file  Social History Narrative   Right handed   Lives alone.     Caffeine use: coffee sometimes  Social Determinants of Health   Financial Resource Strain: Medium Risk  . Difficulty of Paying Living Expenses: Somewhat hard  Food Insecurity: Food Insecurity Present  . Worried About Charity fundraiser in the Last Year: Sometimes true  . Ran Out of Food in the Last Year: Sometimes true  Transportation Needs: No Transportation Needs  . Lack of Transportation (Medical): No  . Lack of Transportation (Non-Medical): No  Physical Activity: Inactive  . Days of Exercise per Week: 0 days  . Minutes of Exercise per Session: 0 min  Stress: Stress Concern Present  . Feeling of Stress : Rather much  Social Connections:   . Frequency of Communication with Friends  and Family: Not on file  . Frequency of Social Gatherings with Friends and Family: Not on file  . Attends Religious Services: Not on file  . Active Member of Clubs or Organizations: Not on file  . Attends Archivist Meetings: Not on file  . Marital Status: Not on file    Outpatient Encounter Medications as of 09/28/2019  Medication Sig  . albuterol (PROAIR HFA) 108 (90 Base) MCG/ACT inhaler Inhale 2 puffs into the lungs every 6 (six) hours as needed for wheezing or shortness of breath.  . APPLE CIDER VINEGAR PO Take 1 capsule by mouth.  . ARIPiprazole (ABILIFY) 2 MG tablet TAKE 1 TABLET BY MOUTH EVERY DAY  . Ascorbic Acid (VITAMIN C) 1000 MG tablet Take 1,000 mg by mouth 2 (two) times a week.  Marland Kitchen aspirin EC 81 MG tablet Take 81 mg by mouth daily.  . B Complex Vitamins (B COMPLEX 100 PO) Take 1 capsule by mouth daily.  Marland Kitchen BYSTOLIC 5 MG tablet TAKE 1 TABLET BY MOUTH EVERY DAY  . Multiple Vitamin (MULTIVITAMIN WITH MINERALS) TABS tablet Take 2 tablets by mouth daily.  . Olmesartan-amLODIPine-HCTZ 40-10-25 MG TABS TAKE 1 TABLET BY MOUTH EVERY DAY   No facility-administered encounter medications on file as of 09/28/2019.    Activities of Daily Living In your present state of health, do you have any difficulty performing the following activities: 09/28/2019  Hearing? N  Vision? Y  Comment has cataracts in both eyes  Difficulty concentrating or making decisions? N  Walking or climbing stairs? N  Dressing or bathing? N  Doing errands, shopping? N  Preparing Food and eating ? N  Using the Toilet? N  In the past six months, have you accidently leaked urine? N  Do you have problems with loss of bowel control? N  Managing your Medications? N  Managing your Finances? N  Housekeeping or managing your Housekeeping? N  Some recent data might be hidden    Patient Care Team: Minette Brine, FNP as PCP - General ([place]) [place], Claudette Stapler, RN as [place]  Management    Assessment:   This is a routine wellness examination for Carrie Figueroa.  Exercise Activities and Dietary recommendations Current Exercise Habits: The patient does not participate in regular exercise at present  Goals    . Exercise 150 min/wk Moderate Activity (pt-stated)    . Weight (lb) < 200 lb (90.7 kg)     09/28/2019, wants to lose 50 pounds       Fall Risk Fall Risk  09/28/2019 07/25/2019 05/03/2019 09/09/2018 08/12/2018  Falls in the past year? 1 0 0 1 0  Comment - - - - Emmi Telephone Survey: data to providers prior to load  Number falls in past yr: - - - 1 -  Comment - - - got foot caught -  Injury with Fall? 0 - - 0 -  Risk for fall due to : History of fall(s);Medication side effect - - History of fall(s);Impaired mobility -  Follow up Falls evaluation completed;Education provided;Falls prevention discussed - - - -   Is the patient's home free of loose throw rugs in walkways, pet beds, electrical cords, etc?   yes      Grab bars in the bathroom? no      Handrails on the stairs?   n/a      Adequate lighting?   yes  Timed Get Up and Go performed: n/a  Depression Screen PHQ 2/9 Scores 09/28/2019 07/25/2019 07/25/2019 09/09/2018  PHQ - 2 Score 0 5 0 1  PHQ- 9 Score 3 13 - 4     Cognitive Function   Montreal Cognitive Assessment  08/02/2019  Visuospatial/ Executive (0/5) 4  Naming (0/3) 3  Attention: Read list of digits (0/2) 2  Attention: Read list of letters (0/1) 1  Attention: Serial 7 subtraction starting at 100 (0/3) 1  Language: Repeat phrase (0/2) 2  Language : Fluency (0/1) 0  Abstraction (0/2) 1  Delayed Recall (0/5) 3  Orientation (0/6) 6  Total 23  Adjusted Score (based on education) 24   6CIT Screen 09/28/2019 09/09/2018  What Year? 0 points 0 points  What month? 0 points -  What time? 0 points 0 points  Count back from 20 0 points 0 points  Months in reverse 2 points 0 points  Repeat phrase 0 points 0 points  Total Score 2 -     Immunization History  Administered Date(s) Administered  . Influenza, High Dose Seasonal PF 09/09/2018  . Pneumococcal Polysaccharide-23 09/07/2017, 07/27/2019    Qualifies for Shingles Vaccine? yes  Screening Tests Health Maintenance  Topic Date Due  . DEXA SCAN  10/31/2011  . MAMMOGRAM  07/08/2020  . TETANUS/TDAP  12/09/2023  . COLONOSCOPY  02/25/2025  . INFLUENZA VACCINE  Completed  . Hepatitis C Screening  Completed  . PNA vac Low Risk Adult  Completed    Cancer Screenings: Lung: Low Dose CT Chest recommended if Age 82-80 years, 30 pack-year currently smoking OR have quit w/in 15years. Patient does not qualify. Breast:  Up to date on Mammogram? Yes   Up to date of Bone Density/Dexa? Yes Colorectal: up to date  Additional Screenings: : Hepatitis C Screening: 07/2019     Plan:    Patient would like to lose 50 pounds.   I have personally reviewed and noted the following in the patient's chart:   . Medical and social history . Use of alcohol, tobacco or illicit drugs  . Current medications and supplements . Functional ability and status . Nutritional status . Physical activity . Advanced directives . List of other physicians . Hospitalizations, surgeries, and ER visits in previous 12 months . Vitals . Screenings to include cognitive, depression, and falls . Referrals and appointments  In addition, I have reviewed and discussed with patient certain preventive protocols, quality metrics, and best practice recommendations. A written personalized care plan for preventive services as well as general preventive health recommendations were provided to patient.     Kellie Simmering, LPN  [id number]

## 2019-09-28 NOTE — Patient Instructions (Signed)
Carrie Figueroa , Thank you for taking time to come for your Medicare Wellness Visit. I appreciate your ongoing commitment to your health goals. Please review the following plan we discussed and let me know if I can assist you in the future.   Screening recommendations/referrals: Colonoscopy: 02/2015 Mammogram: tomorrow Bone Density: referred Recommended yearly ophthalmology/optometry visit for glaucoma screening and checkup Recommended yearly dental visit for hygiene and checkup  Vaccinations: Influenza vaccine: 06/2019 Pneumococcal vaccine: 07/2019 Tdap vaccine: 11/2013 Shingles vaccine: discussed    Advanced directives: Advance directive discussed with you today. Even though you declined this today please call our office should you change your mind and we can give you the proper paperwork for you to fill out.   Conditions/risks identified: obesity  Next appointment:    Preventive Care 101 Years and Older, Female Preventive care refers to lifestyle choices and visits with your health care provider that can promote health and wellness. What does preventive care include?  A yearly physical exam. This is also called an annual well check.  Dental exams once or twice a year.  Routine eye exams. Ask your health care provider how often you should have your eyes checked.  Personal lifestyle choices, including:  Daily care of your teeth and gums.  Regular physical activity.  Eating a healthy diet.  Avoiding tobacco and drug use.  Limiting alcohol use.  Practicing safe sex.  Taking low-dose aspirin every day.  Taking vitamin and mineral supplements as recommended by your health care provider. What happens during an annual well check? The services and screenings done by your health care provider during your annual well check will depend on your age, overall health, lifestyle risk factors, and family history of disease. Counseling  Your health care provider may ask you questions  about your:  Alcohol use.  Tobacco use.  Drug use.  Emotional well-being.  Home and relationship well-being.  Sexual activity.  Eating habits.  History of falls.  Memory and ability to understand (cognition).  Work and work Statistician.  Reproductive health. Screening  You may have the following tests or measurements:  Height, weight, and BMI.  Blood pressure.  Lipid and cholesterol levels. These may be checked every 5 years, or more frequently if you are over 68 years old.  Skin check.  Lung cancer screening. You may have this screening every year starting at age 67 if you have a 30-pack-year history of smoking and currently smoke or have quit within the past 15 years.  Fecal occult blood test (FOBT) of the stool. You may have this test every year starting at age 34.  Flexible sigmoidoscopy or colonoscopy. You may have a sigmoidoscopy every 5 years or a colonoscopy every 10 years starting at age 26.  Hepatitis C blood test.  Hepatitis B blood test.  Sexually transmitted disease (STD) testing.  Diabetes screening. This is done by checking your blood sugar (glucose) after you have not eaten for a while (fasting). You may have this done every 1-3 years.  Bone density scan. This is done to screen for osteoporosis. You may have this done starting at age 45.  Mammogram. This may be done every 1-2 years. Talk to your health care provider about how often you should have regular mammograms. Talk with your health care provider about your test results, treatment options, and if necessary, the need for more tests. Vaccines  Your health care provider may recommend certain vaccines, such as:  Influenza vaccine. This is recommended every year.  Tetanus, diphtheria, and acellular pertussis (Tdap, Td) vaccine. You may need a Td booster every 10 years.  Zoster vaccine. You may need this after age 31.  Pneumococcal 13-valent conjugate (PCV13) vaccine. One dose is  recommended after age 15.  Pneumococcal polysaccharide (PPSV23) vaccine. One dose is recommended after age 79. Talk to your health care provider about which screenings and vaccines you need and how often you need them. This information is not intended to replace advice given to you by your health care provider. Make sure you discuss any questions you have with your health care provider. Document Released: 10/05/2015 Document Revised: 05/28/2016 Document Reviewed: 07/10/2015 Elsevier Interactive Patient Education  2017 [place] Prevention in the Home Falls can cause injuries. They can happen to people of all ages. There are many things you can do to make your home safe and to help prevent falls. What can I do on the outside of my home?  Regularly fix the edges of walkways and driveways and fix any cracks.  Remove anything that might make you trip as you walk through a door, such as a raised step or threshold.  Trim any bushes or trees on the path to your home.  Use bright outdoor lighting.  Clear any walking paths of anything that might make someone trip, such as rocks or tools.  Regularly check to see if handrails are loose or broken. Make sure that both sides of any steps have handrails.  Any raised decks and porches should have guardrails on the edges.  Have any leaves, snow, or ice cleared regularly.  Use sand or salt on walking paths during winter.  Clean up any spills in your garage right away. This includes oil or grease spills. What can I do in the bathroom?  Use night lights.  Install grab bars by the toilet and in the tub and shower. Do not use towel bars as grab bars.  Use non-skid mats or decals in the tub or shower.  If you need to sit down in the shower, use a plastic, non-slip stool.  Keep the floor dry. Clean up any water that spills on the floor as soon as it happens.  Remove soap buildup in the tub or shower regularly.  Attach bath mats  securely with double-sided non-slip rug tape.  Do not have throw rugs and other things on the floor that can make you trip. What can I do in the bedroom?  Use night lights.  Make sure that you have a light by your bed that is easy to reach.  Do not use any sheets or blankets that are too big for your bed. They should not hang down onto the floor.  Have a firm chair that has side arms. You can use this for support while you get dressed.  Do not have throw rugs and other things on the floor that can make you trip. What can I do in the kitchen?  Clean up any spills right away.  Avoid walking on wet floors.  Keep items that you use a lot in easy-to-reach places.  If you need to reach something above you, use a strong step stool that has a grab bar.  Keep electrical cords out of the way.  Do not use floor polish or wax that makes floors slippery. If you must use wax, use non-skid floor wax.  Do not have throw rugs and other things on the floor that can make you trip. What can I do  with my stairs?  Do not leave any items on the stairs.  Make sure that there are handrails on both sides of the stairs and use them. Fix handrails that are broken or loose. Make sure that handrails are as long as the stairways.  Check any carpeting to make sure that it is firmly attached to the stairs. Fix any carpet that is loose or worn.  Avoid having throw rugs at the top or bottom of the stairs. If you do have throw rugs, attach them to the floor with carpet tape.  Make sure that you have a light switch at the top of the stairs and the bottom of the stairs. If you do not have them, ask someone to add them for you. What else can I do to help prevent falls?  Wear shoes that:  Do not have high heels.  Have rubber bottoms.  Are comfortable and fit you well.  Are closed at the toe. Do not wear sandals.  If you use a stepladder:  Make sure that it is fully opened. Do not climb a closed  stepladder.  Make sure that both sides of the stepladder are locked into place.  Ask someone to hold it for you, if possible.  Clearly mark and make sure that you can see:  Any grab bars or handrails.  First and last steps.  Where the edge of each step is.  Use tools that help you move around (mobility aids) if they are needed. These include:  Canes.  Walkers.  Scooters.  Crutches.  Turn on the lights when you go into a dark area. Replace any light bulbs as soon as they burn out.  Set up your furniture so you have a clear path. Avoid moving your furniture around.  If any of your floors are uneven, fix them.  If there are any pets around you, be aware of where they are.  Review your medicines with your doctor. Some medicines can make you feel dizzy. This can increase your chance of falling. Ask your doctor what other things that you can do to help prevent falls. This information is not intended to replace advice given to you by your health care provider. Make sure you discuss any questions you have with your health care provider. Document Released: 07/05/2009 Document Revised: 02/14/2016 Document Reviewed: 10/13/2014 Elsevier Interactive Patient Education  2017 [employer].

## 2019-09-29 ENCOUNTER — Ambulatory Visit
Admission: RE | Admit: 2019-09-29 | Discharge: 2019-09-29 | Disposition: A | Discharge status: Home / Self Care | Payer: Medicare HMO | Source: Ambulatory Visit | Attending: Nurse Practitioner | Admitting: Nurse Practitioner

## 2019-09-29 ENCOUNTER — Other Ambulatory Visit: Payer: Self-pay

## 2019-09-29 DIAGNOSIS — Z1231 Encounter for screening mammogram for malignant neoplasm of breast: Secondary | ICD-10-CM

## 2019-09-30 ENCOUNTER — Other Ambulatory Visit: Payer: Self-pay | Admitting: Nurse Practitioner

## 2019-09-30 DIAGNOSIS — R928 Other abnormal and inconclusive findings on diagnostic imaging of breast: Secondary | ICD-10-CM

## 2019-10-06 ENCOUNTER — Ambulatory Visit
Admission: RE | Admit: 2019-10-06 | Discharge: 2019-10-06 | Disposition: A | Discharge status: Home / Self Care | Payer: Medicare HMO | Source: Ambulatory Visit | Attending: Nurse Practitioner | Admitting: Nurse Practitioner

## 2019-10-06 ENCOUNTER — Other Ambulatory Visit: Payer: Self-pay

## 2019-10-06 ENCOUNTER — Ambulatory Visit: Admission: RE | Admit: 2019-10-06 | Payer: Medicare HMO | Source: Ambulatory Visit

## 2019-10-06 DIAGNOSIS — R928 Other abnormal and inconclusive findings on diagnostic imaging of breast: Secondary | ICD-10-CM

## 2019-10-12 ENCOUNTER — Ambulatory Visit: Payer: Medicare HMO | Admitting: Neurology

## 2019-10-12 ENCOUNTER — Encounter: Payer: Self-pay | Admitting: Neurology

## 2019-10-12 ENCOUNTER — Other Ambulatory Visit: Payer: Self-pay

## 2019-10-12 VITALS — BP 125/78 | HR 85 | Temp 97.2°F | Ht 62.5 in | Wt 260.5 lb

## 2019-10-12 DIAGNOSIS — R441 Visual hallucinations: Secondary | ICD-10-CM | POA: Insufficient documentation

## 2019-10-12 DIAGNOSIS — F329 Major depressive disorder, single episode, unspecified: Secondary | ICD-10-CM | POA: Diagnosis not present

## 2019-10-12 DIAGNOSIS — H5316 Psychophysical visual disturbances: Secondary | ICD-10-CM | POA: Insufficient documentation

## 2019-10-12 DIAGNOSIS — F419 Anxiety disorder, unspecified: Secondary | ICD-10-CM

## 2019-10-12 DIAGNOSIS — H269 Unspecified cataract: Secondary | ICD-10-CM | POA: Diagnosis not present

## 2019-10-12 DIAGNOSIS — F32A Depression, unspecified: Secondary | ICD-10-CM

## 2019-10-12 MED ORDER — ARIPIPRAZOLE 2 MG PO TABS: 2.0000 mg | ORAL_TABLET | Freq: Every day | ORAL | 3 refills | Status: DC

## 2019-10-12 NOTE — Progress Notes (Signed)
[facility]  PATIENT: Carrie Figueroa DOB: [birth year]  REFERRING DOCTOR OR PCP: Minette Brine, FNP SOURCE: Patient, notes from primary care, lab reports.  _________________________________   HISTORICAL  CHIEF COMPLAINT:  Chief Complaint  Patient presents with  . Follow-up    RM 13, alone. Last seen 08/02/2019. She is having more hallucinations. She is having more headaches since starting Abilify. She never had as many headaches.     HISTORY OF PRESENT ILLNESS:  She is a 74 year old woman with visual hallucinations  Update 10/12/2019: She is having more visual hallucinations.   She often sees animals or people or spiders on the walls, ceiling or curtains.    The Abilify helped her sleep but did not help her hallucinations.  She does think it does calm her before bedtime.  She has no threatening auditory hallucinations   She does all her ADL'S around the house.   She does drive just to the store or doctor's office only during the day and in decent weather.  She has some depression but no history of bipolar disease or schizophenia.   She sleeps better at night since starting Abilify but still might wake up a couple hours later.      I personally reviewed her MRi from 08/24/2019 and it was normal for age with just mild chronic microvascular ischemic change [name], [address])  She has cataracts and she states her ophthalmologist will do surgery in May.     She gets headaches in the mornings.   She snores.  She had a sleep study but does not have OSA.  See below for details.    From Consult  08/02/19: I had the pleasure of seeing patient, Carrie Figueroa, at [facility] for neurologic consultation regarding her visual hallucinations.  She is a 74 year old woman who has had visual hallucinations for several months.   Initially, she would see ants and flies.    Now she sees animals a lot.   She saw a tiger on the table and mistook a pressure  cooker cord for a snake.   She has seen a woman after one nap and   These seem very real to her and the tiger and the snake were scary for her.    Some have occurred when she opens her eyes after a nap or when laying down in bed.    Often she will see spiders in the cracks of the roof when she tries to fall asleep.     She has cataracts and surgery was recommended.     In April, she lost her mother who died in isolation at a nursing home and seeing her for 5 weeks was very upsetting to her. She was seeing her daily for months before in the nursing home.   Her mom had dementia.    She could barely talk to others at the funeral due to social distancing.   She has been more isolated due to Covid-19.       She has not had any known episodes of REM behavior disorder.    She does not sleep well as neighbors are loud.   She had a PSG several years ago and was told she did not have OSA though an earlier one reportedly did.   PSG 04/28/2017 showed rapid sleep onset of 3 minutes, AHI = 1.6 (10 in REM), 89% sleep efficiency.  There was no stage N3 sleep..  She had soft snoring.  PLMSI was 6.4 but associated arousal index was only 0.8 recent blood work shows normal vitamin B12 and T. pallidum screening.  She is walking well and has no recent falls (fell in 2014 and hit head on linoleum floor).     Balance is slightly worse than a couple years ago but no much worse.    Her medications are only a BP combination (Olmesartan-amlodipine-HCTZ), ASA and vitamins.     Montreal Cognitive Assessment  08/02/2019  Visuospatial/ Executive (0/5) 4  Naming (0/3) 3  Attention: Read list of digits (0/2) 2  Attention: Read list of letters (0/1) 1  Attention: Serial 7 subtraction starting at 100 (0/3) 1  Language: Repeat phrase (0/2) 2  Language : Fluency (0/1) 0  Abstraction (0/2) 1  Delayed Recall (0/5) 3  Orientation (0/6) 6  Total 23  Adjusted Score (based on education) 24       REVIEW OF SYSTEMS: Constitutional:  No fevers, chills, sweats, or change in appetite Eyes: No visual changes, double vision, eye pain Ear, nose and throat: No hearing loss, ear pain, nasal congestion, sore throat Cardiovascular: No chest pain, palpitations Respiratory: No shortness of breath at rest or with exertion.   No wheezes GastrointestinaI: No nausea, vomiting, diarrhea, abdominal pain, fecal incontinence Genitourinary: No dysuria, urinary retention or frequency.  No nocturia. Musculoskeletal: No neck pain, back pain Integumentary: No rash, pruritus, skin lesions Neurological: as above Psychiatric: No depression at this time.  No anxiety Endocrine: No palpitations, diaphoresis, change in appetite, change in weigh or increased thirst Hematologic/Lymphatic: No anemia, purpura, petechiae. Allergic/Immunologic: No itchy/runny eyes, nasal congestion, recent allergic reactions, rashes  ALLERGIES: No Known Allergies  HOME MEDICATIONS:  Current Outpatient Medications:  .  albuterol (PROAIR HFA) 108 (90 Base) MCG/ACT inhaler, Inhale 2 puffs into the lungs every 6 (six) hours as needed for wheezing or shortness of breath., Disp: 18 g, Rfl: 1 .  APPLE CIDER VINEGAR PO, Take 1 capsule by mouth., Disp: , Rfl:  .  ARIPiprazole (ABILIFY) 2 MG tablet, Take 1 tablet (2 mg total) by mouth daily., Disp: 90 tablet, Rfl: 3 .  Ascorbic Acid (VITAMIN C) 1000 MG tablet, Take 1,000 mg by mouth 2 (two) times a week., Disp: , Rfl:  .  aspirin EC 81 MG tablet, Take 81 mg by mouth daily., Disp: , Rfl:  .  B Complex Vitamins (B COMPLEX 100 PO), Take 1 capsule by mouth daily., Disp: , Rfl:  .  BYSTOLIC 5 MG tablet, TAKE 1 TABLET BY MOUTH EVERY DAY, Disp: 30 tablet, Rfl: 0 .  Multiple Vitamin (MULTIVITAMIN WITH MINERALS) TABS tablet, Take 2 tablets by mouth daily., Disp: , Rfl:  .  Olmesartan-amLODIPine-HCTZ 40-10-25 MG TABS, TAKE 1 TABLET BY MOUTH EVERY DAY, Disp: 30 tablet, Rfl: 0  PAST MEDICAL HISTORY: Past Medical History:  Diagnosis  Date  . Hypertension     PAST SURGICAL HISTORY: Past Surgical History:  Procedure Laterality Date  . BREAST CYST ASPIRATION  11/10/2013  . BUNIONECTOMY    . tubal plasty  1974    FAMILY HISTORY: Family History  Problem Relation Age of Onset  . Heart disease Mother        CHF  . Diabetes Mother   . COPD Mother   . Hyperthyroidism Mother   . Dementia Mother   . Heart disease Father   . Diabetes Father   . Breast cancer Maternal Grandmother   . Breast cancer Sister     SOCIAL HISTORY:  Social History  Socioeconomic History  . Marital status: Single    Spouse name: Not on file  . Number of children: Not on file  . Years of education: Not on file  . Highest education level: Not on file  Occupational History  . Occupation: Med Engineer, production: RETIRED  Tobacco Use  . Smoking status: Former Smoker    Packs/day: 0.10    Years: 1.00    Pack years: 0.10    Types: Cigarettes  . Smokeless tobacco: Never Used  Substance and Sexual Activity  . Alcohol use: Not Currently  . Drug use: No  . Sexual activity: Not Currently  Other Topics Concern  . Not on file  Social History Narrative   Right handed   Lives alone.     Caffeine use: coffee sometimes   Social Determinants of Health   Financial Resource Strain: Medium Risk  . Difficulty of Paying Living Expenses: Somewhat hard  Food Insecurity: Food Insecurity Present  . Worried About Charity fundraiser in the Last Year: Sometimes true  . Ran Out of Food in the Last Year: Sometimes true  Transportation Needs: No Transportation Needs  . Lack of Transportation (Medical): No  . Lack of Transportation (Non-Medical): No  Physical Activity: Inactive  . Days of Exercise per Week: 0 days  . Minutes of Exercise per Session: 0 min  Stress: Stress Concern Present  . Feeling of Stress : Rather much  Social Connections:   . Frequency of Communication with Friends and Family: Not on file  . Frequency of Social Gatherings  with Friends and Family: Not on file  . Attends Religious Services: Not on file  . Active Member of Clubs or Organizations: Not on file  . Attends Archivist Meetings: Not on file  . Marital Status: Not on file  Intimate Partner Violence:   . Fear of Current or Ex-Partner: Not on file  . Emotionally Abused: Not on file  . Physically Abused: Not on file  . Sexually Abused: Not on file     PHYSICAL EXAM  Vitals:   10/12/19 1137  BP: 125/78  Pulse: 85  Temp: (!) 97.2 F (36.2 C)  SpO2: 98%  Weight: 260 lb 8 oz (118.2 kg)  Height: 5' 2.5" (1.588 m)    Body mass index is 46.89 kg/m.   General: The patient is well-developed and well-nourished and in no acute distress  HEENT:  Head is [place]/AT.  Sclera are anicteric.  Visual acuity is 20/50 OD and 20/100 OS.  Skin: Extremities are without rash or  edema.   Neurologic Exam  Mental status: The patient is alert and oriented x 3 at the time of the examination.  In conversation, memory and focus appear to be fairly normal.    Speech is normal.  Cranial nerves: Extraocular movements are full. There is good facial sensation to soft touch bilaterally.Facial strength is normal.  Trapezius and sternocleidomastoid strength is normal. No dysarthria is noted.  No obvious hearing deficits are noted.  Motor:  Muscle bulk is normal.   Tone is normal. Strength is  5 / 5 in all 4 extremities.   Sensory: Sensory testing is intact to pinprick, soft touch and vibration sensation in all 4 extremities.  Coordination: Cerebellar testing reveals good finger-nose-finger and heel-to-shin bilaterally.  Gait and station: Station is normal.   Gait is normal. Tandem gait is slightly wide.  She had mild retropulsion.  Romberg is negative.   Reflexes: Deep tendon reflexes  are symmetric and normal bilaterally.   Plantar responses are flexor.    DIAGNOSTIC DATA (LABS, IMAGING, TESTING) - I reviewed patient records, labs, notes, testing and  imaging myself where available.  Lab Results  Component Value Date   WBC 7.5 09/09/2018   HGB 13.3 09/09/2018   HCT 40.1 09/09/2018   MCV 85 09/09/2018   PLT 338 09/09/2018      Component Value Date/Time   NA 136 05/03/2019 1124   K 4.2 05/03/2019 1124   CL 96 05/03/2019 1124   CO2 25 05/03/2019 1124   GLUCOSE 111 (H) 05/03/2019 1124   GLUCOSE 116 (H) 05/28/2014 2155   BUN 21 05/03/2019 1124   CREATININE 1.10 (H) 05/03/2019 1124   CALCIUM 9.6 05/03/2019 1124   PROT 6.7 05/03/2019 1124   ALBUMIN 4.2 05/03/2019 1124   AST 26 05/03/2019 1124   ALT 21 05/03/2019 1124   ALKPHOS 66 05/03/2019 1124   BILITOT 0.4 05/03/2019 1124   GFRNONAA 50 (L) 05/03/2019 1124   GFRAA 58 (L) 05/03/2019 1124   Lab Results  Component Value Date   CHOL 157 05/03/2019   HDL 59 05/03/2019   LDLCALC 87 05/03/2019   TRIG 56 05/03/2019   CHOLHDL 2.7 05/03/2019   Lab Results  Component Value Date   HGBA1C 5.5 05/03/2019   Lab Results  Component Value Date   [id number] (H) 05/03/2019   Lab Results  Component Value Date   TSH 1.870 08/02/2019       ASSESSMENT AND PLAN  Sherran Needs syndrome  Cataract of both eyes, unspecified cataract type  Visual hallucination  Anxiety and depression  1.   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  She is advised to proceed with her cataract surgery in the hope that there will be some improvement. 2.   Continue Abilify as it helps her sleep and calms her at night. 3.   Return in 6 months or sooner if there are new or worsening neurologic symptoms.  Korayma Hagwood A. Felecia Shelling, MD, [facility] [id number], [id number] PM Certified in Neurology, Clinical Neurophysiology, Sleep Medicine and Neuroimaging  [facility] [address] [place], [place] [address] [phone]

## 2019-10-12 NOTE — Patient Instructions (Signed)
Charles Bonnet Syndrome

## 2019-10-23 ENCOUNTER — Other Ambulatory Visit: Payer: Self-pay | Admitting: Internal Medicine

## 2019-11-06 ENCOUNTER — Other Ambulatory Visit: Payer: Self-pay | Admitting: Nurse Practitioner

## 2019-11-10 ENCOUNTER — Ambulatory Visit: Payer: Medicare HMO

## 2019-11-14 ENCOUNTER — Ambulatory Visit: Payer: Medicare HMO | Attending: Family

## 2019-11-14 DIAGNOSIS — Z23 Encounter for immunization: Secondary | ICD-10-CM | POA: Insufficient documentation

## 2019-11-15 DIAGNOSIS — Z86010 Personal history of colonic polyps: Secondary | ICD-10-CM | POA: Diagnosis not present

## 2019-12-13 ENCOUNTER — Ambulatory Visit: Payer: Medicare HMO | Attending: Family

## 2019-12-13 DIAGNOSIS — Z23 Encounter for immunization: Secondary | ICD-10-CM

## 2019-12-13 NOTE — Progress Notes (Signed)
   Covid-19 Vaccination Clinic  Name:  Carrie Figueroa    MRN: [id number] DOB: 02/11/1946  12/13/2019  [name] was observed post Covid-19 immunization for 15 minutes without incident. She was provided with Vaccine Information Sheet and instruction to access the V-Safe system.   [name] was instructed to call 911 with any severe reactions post vaccine: Marland Kitchen Difficulty breathing  . Swelling of face and throat  . A fast heartbeat  . A bad rash all over body  . Dizziness and weakness   Immunizations Administered    Name Date Dose VIS Date Route   Moderna COVID-19 Vaccine 12/13/2019  2:26 PM 0.5 mL 08/23/2019 Intramuscular   Manufacturer: Moderna   LotMV:[id number]   [place]PO:[id number]

## 2019-12-26 ENCOUNTER — Other Ambulatory Visit: Payer: Medicare HMO

## 2020-01-24 ENCOUNTER — Telehealth: Payer: Self-pay | Admitting: Neurology

## 2020-01-24 MED ORDER — ARIPIPRAZOLE 2 MG PO TABS: 4.0000 mg | ORAL_TABLET | Freq: Every day | ORAL | 5 refills | Status: DC

## 2020-01-24 NOTE — Telephone Encounter (Addendum)
Called and LVM letting her know MD got her message and we will send in new rx for 2 pills po qd to [facility] on file. Asked her to call back if she has any further questions.

## 2020-01-24 NOTE — Telephone Encounter (Signed)
Dr. Felecia Shelling- please advise. I do not see where we recommended her to increase to 2.

## 2020-01-24 NOTE — Telephone Encounter (Signed)
Pt has called to report that 1 was no longer working so she has gone to 2 a day.  Pt took her last 2 last night, she is need of a refill of the ARIPiprazole (ABILIFY) 2 MG tablet [facility] #[id number]

## 2020-01-24 NOTE — Addendum Note (Signed)
Addended by: Wyvonnia Lora on: 01/24/2020 12:51 PM   Modules accepted: Orders

## 2020-01-24 NOTE — Telephone Encounter (Signed)
I don't believe we advised her to increase dose  Howver it is still a low dose and if 2 pills work better, we can send in #60 with 5 refills

## 2020-01-24 NOTE — Telephone Encounter (Signed)
Pt called back stating she is out of medication and is wanting to know the update on this request. Please advise.

## 2020-02-01 DIAGNOSIS — H524 Presbyopia: Secondary | ICD-10-CM | POA: Diagnosis not present

## 2020-02-01 DIAGNOSIS — H52203 Unspecified astigmatism, bilateral: Secondary | ICD-10-CM | POA: Diagnosis not present

## 2020-02-01 DIAGNOSIS — H5203 Hypermetropia, bilateral: Secondary | ICD-10-CM | POA: Diagnosis not present

## 2020-02-01 DIAGNOSIS — H2513 Age-related nuclear cataract, bilateral: Secondary | ICD-10-CM | POA: Diagnosis not present

## 2020-02-16 ENCOUNTER — Other Ambulatory Visit: Payer: Self-pay | Admitting: Neurology

## 2020-03-06 ENCOUNTER — Other Ambulatory Visit: Payer: Medicare HMO

## 2020-03-12 DIAGNOSIS — H25013 Cortical age-related cataract, bilateral: Secondary | ICD-10-CM | POA: Diagnosis not present

## 2020-03-12 DIAGNOSIS — H5203 Hypermetropia, bilateral: Secondary | ICD-10-CM | POA: Diagnosis not present

## 2020-04-11 ENCOUNTER — Ambulatory Visit: Payer: Medicare HMO | Admitting: Family Medicine

## 2020-04-24 ENCOUNTER — Ambulatory Visit: Payer: Medicare HMO | Admitting: Family Medicine

## 2020-04-24 ENCOUNTER — Encounter: Payer: Self-pay | Admitting: Family Medicine

## 2020-04-24 VITALS — BP 130/70 | HR 62 | Ht 62.0 in | Wt 259.0 lb

## 2020-04-24 DIAGNOSIS — H269 Unspecified cataract: Secondary | ICD-10-CM | POA: Diagnosis not present

## 2020-04-24 DIAGNOSIS — H5316 Psychophysical visual disturbances: Secondary | ICD-10-CM | POA: Diagnosis not present

## 2020-04-24 DIAGNOSIS — R441 Visual hallucinations: Secondary | ICD-10-CM

## 2020-04-24 NOTE — Patient Instructions (Signed)
We will continue Ability 2 tablet daily. Continue close follow up with ophthalmology. Return to see Dr Felecia Shelling in 6 months

## 2020-04-24 NOTE — Progress Notes (Signed)
PATIENT: Carrie Figueroa DOB: 03/14/1946  REASON FOR VISIT: follow up HISTORY FROM: patient  Chief Complaint  Patient presents with  . 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 Pt says the symptoms are the same     HISTORY OF PRESENT ILLNESS: Today 04/24/20 Carrie Figueroa is a 74 y.o. female here today for follow up. She reports that symptoms are fairly stable. She continues to have visual hallucinations but feels that these wax and wane. Sometimes she will smell things that are not real. She has a reoccurring hallucination of two men burying a body. She will smell an usual odor that she thinks is the dead body. She continues Abilify 4mg  daily. She feels that this helps her sleep. Mood is good.   She continues close follow up with ophthalmology. She is considering surgery for cataracts. She is scheduled to have one eye corrected on 9/29 and the other eye in October.   HISTORY: (copied from Dr Garth Bigness note on 10/12/2019)  She is a 74 year old woman with visual hallucinations  Update 10/12/2019: She is having more visual hallucinations.   She often sees animals or people or spiders on the walls, ceiling or curtains.    The Abilify helped her sleep but did not help her hallucinations.  She does think it does calm her before bedtime.  She has no threatening auditory hallucinations   She does all her ADL'S around the house.   She does drive just to the store or doctor's office only during the day and in decent weather.  She has some depression but no history of bipolar disease or schizophenia.   She sleeps better at night since starting Abilify but still might wake up a couple hours later.      I personally reviewed her MRi from 08/24/2019 and it was normal for age with just mild chronic microvascular ischemic change Carrie Figueroa, [address])  She has cataracts and she states her ophthalmologist will do surgery in May.     She gets headaches in the mornings.   She  snores.  She had a sleep study but does not have OSA.  See below for details.    From Consult  08/02/19: I had the pleasure of seeing patient, Carrie Figueroa, at [facility] for neurologic consultation regarding her visual hallucinations.  She is a 74 year old woman who has had visual hallucinations for several months.   Initially, she would see ants and flies.    Now she sees animals a lot.   She saw a tiger on the table and mistook a pressure cooker cord for a snake.   She has seen a woman after one nap and   These seem very real to her and the tiger and the snake were scary for her.    Some have occurred when she opens her eyes after a nap or when laying down in bed.    Often she will see spiders in the cracks of the roof when she tries to fall asleep.     She has cataracts and surgery was recommended.     In April, she lost her mother who died in isolation at a nursing home and seeing her for 5 weeks was very upsetting to her. She was seeing her daily for months before in the nursing home.   Her mom had dementia.    She could barely talk to others at the funeral due to  social distancing.   She has been more isolated due to Covid-19.       She has not had any known episodes of REM behavior disorder.    She does not sleep well as neighbors are loud.   She had a PSG several years ago and was told she did not have OSA though an earlier one reportedly did.   PSG 04/28/2017 showed rapid sleep onset of 3 minutes, AHI = 1.6 (10 in REM), 89% sleep efficiency.  There was no stage N3 sleep..  She had soft snoring.   PLMSI was 6.4 but associated arousal index was only 0.8 recent blood work shows normal vitamin B12 and T. pallidum screening.  She is walking well and has no recent falls (fell in 2014 and hit head on linoleum floor).     Balance is slightly worse than a couple years ago but no much worse.    Her medications are only a BP combination (Olmesartan-amlodipine-HCTZ), ASA  and vitamins.     Montreal Cognitive Assessment  08/02/2019  Visuospatial/ Executive (0/5) 4  Naming (0/3) 3  Attention: Read list of digits (0/2) 2  Attention: Read list of letters (0/1) 1  Attention: Serial 7 subtraction starting at 100 (0/3) 1  Language: Repeat phrase (0/2) 2  Language : Fluency (0/1) 0  Abstraction (0/2) 1  Delayed Recall (0/5) 3  Orientation (0/6) 6  Total 23  Adjusted Score (based on education) 24        REVIEW OF SYSTEMS: Out of a complete 14 system review of symptoms, the patient complains only of the following symptoms, visual hallucinations and all other reviewed systems are negative.  ALLERGIES: No Known Allergies  HOME MEDICATIONS: Outpatient Medications Prior to Visit  Medication Sig Dispense Refill  . albuterol (PROAIR HFA) 108 (90 Base) MCG/ACT inhaler Inhale 2 puffs into the lungs every 6 (six) hours as needed for wheezing or shortness of breath. 18 g 1  . APPLE CIDER VINEGAR PO Take 1 capsule by mouth.    . ARIPiprazole (ABILIFY) 2 MG tablet TAKE 2 TABLETS BY MOUTH DAILY 180 tablet 2  . Ascorbic Acid (VITAMIN C) 1000 MG tablet Take 1,000 mg by mouth 2 (two) times a week.    Marland Kitchen aspirin EC 81 MG tablet Take 81 mg by mouth daily.    . B Complex Vitamins (B COMPLEX 100 PO) Take 1 capsule by mouth daily.    Marland Kitchen BYSTOLIC 5 MG tablet TAKE 1 TABLET BY MOUTH EVERY DAY 90 tablet 1  . Multiple Vitamin (MULTIVITAMIN WITH MINERALS) TABS tablet Take 2 tablets by mouth daily.    . Olmesartan-amLODIPine-HCTZ 40-10-25 MG TABS TAKE 1 TABLET BY MOUTH EVERY DAY 90 tablet 1   No facility-administered medications prior to visit.    PAST MEDICAL HISTORY: Past Medical History:  Diagnosis Date  . Hypertension     PAST SURGICAL HISTORY: Past Surgical History:  Procedure Laterality Date  . BREAST CYST ASPIRATION  11/10/2013  . BUNIONECTOMY    . tubal plasty  1974    FAMILY HISTORY: Family History  Problem Relation Age of Onset  . Heart disease Mother          CHF  . Diabetes Mother   . COPD Mother   . Hyperthyroidism Mother   . Dementia Mother   . Heart disease Father   . Diabetes Father   . Breast cancer Maternal Grandmother   . Breast cancer Sister     SOCIAL HISTORY: Social History  Socioeconomic History  . Marital status: Single    Spouse name: Not on file  . Number of children: Not on file  . Years of education: Not on file  . Highest education level: Not on file  Occupational History  . Occupation: Med Engineer, production: RETIRED  Tobacco Use  . Smoking status: Former Smoker    Packs/day: 0.10    Years: 1.00    Pack years: 0.10    Types: Cigarettes  . Smokeless tobacco: Never Used  Vaping Use  . Vaping Use: Never used  Substance and Sexual Activity  . Alcohol use: Not Currently  . Drug use: No  . Sexual activity: Not Currently  Other Topics Concern  . Not on file  Social History Narrative   Right handed   Lives alone.     Caffeine use: coffee sometimes   Social Determinants of Health   Financial Resource Strain: Medium Risk  . Difficulty of Paying Living Expenses: Somewhat hard  Food Insecurity: Food Insecurity Present  . Worried About Charity fundraiser in the Last Year: Sometimes true  . Ran Out of Food in the Last Year: Sometimes true  Transportation Needs: No Transportation Needs  . Lack of Transportation (Medical): No  . Lack of Transportation (Non-Medical): No  Physical Activity: Inactive  . Days of Exercise per Week: 0 days  . Minutes of Exercise per Session: 0 min  Stress: Stress Concern Present  . Feeling of Stress : Rather much  Social Connections:   . Frequency of Communication with Friends and Family:   . Frequency of Social Gatherings with Friends and Family:   . Attends Religious Services:   . Active Member of Clubs or Organizations:   . Attends Archivist Meetings:   Marland Kitchen Marital Status:   Intimate Partner Violence:   . Fear of Current or Ex-Partner:   . Emotionally  Abused:   Marland Kitchen Physically Abused:   . Sexually Abused:       PHYSICAL EXAM  Vitals:   04/24/20 1350  BP: 130/70  Pulse: 62  Weight: 259 lb (117.5 kg)  Height: 5\' 2"  (1.575 m)   Body mass index is 47.37 kg/m.  Generalized: Well developed, in no acute distress  Cardiology: normal rate and rhythm, no murmur noted Respiratory: clear to auscultation bilaterally  Neurological examination  Mentation: Alert oriented to time, place, history taking. Follows all commands speech and language fluent Cranial nerve II-XII: Pupils were equal round reactive to light. Extraocular movements were full, visual field were full  Motor: The motor testing reveals 5 over 5 strength of all 4 extremities. Good symmetric motor tone is noted throughout.  Sensory: Sensory testing is intact to soft touch on all 4 extremities. No evidence of extinction is noted.  Coordination: Cerebellar testing reveals good finger-nose-finger and heel-to-shin bilaterally.  Gait and station: Gait is normal.   DIAGNOSTIC DATA (LABS, IMAGING, TESTING) - I reviewed patient records, labs, notes, testing and imaging myself where available.  No flowsheet data found.   Lab Results  Component Value Date   WBC 7.5 09/09/2018   HGB 13.3 09/09/2018   HCT 40.1 09/09/2018   MCV 85 09/09/2018   PLT 338 09/09/2018      Component Value Date/Time   NA 136 05/03/2019 1124   K 4.2 05/03/2019 1124   CL 96 05/03/2019 1124   CO2 25 05/03/2019 1124   GLUCOSE 111 (H) 05/03/2019 1124   GLUCOSE 116 (H) 05/28/2014 2155  BUN 21 05/03/2019 1124   CREATININE 1.10 (H) 05/03/2019 1124   CALCIUM 9.6 05/03/2019 1124   PROT 6.7 05/03/2019 1124   ALBUMIN 4.2 05/03/2019 1124   AST 26 05/03/2019 1124   ALT 21 05/03/2019 1124   ALKPHOS 66 05/03/2019 1124   BILITOT 0.4 05/03/2019 1124   GFRNONAA 50 (L) 05/03/2019 1124   GFRAA 58 (L) 05/03/2019 1124   Lab Results  Component Value Date   CHOL 157 05/03/2019   HDL 59 05/03/2019   LDLCALC 87  05/03/2019   TRIG 56 05/03/2019   CHOLHDL 2.7 05/03/2019   Lab Results  Component Value Date   HGBA1C 5.5 05/03/2019   Lab Results  Component Value Date   VITAMINB12 1,659 (H) 05/03/2019   Lab Results  Component Value Date   TSH 1.870 08/02/2019       ASSESSMENT AND PLAN 74 y.o. year old female  has a past medical history of Hypertension. 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 Cataract of both eyes, unspecified cataract type  H26.9   3. Visual hallucination  R44.1     Ms Jaquess feels symptoms are fairly stable. We will continue Abilify 4mg  daily. She was encouraged to continue close follow up with ophthalmology. I am hopeful that symptoms may improve following cataract surgery. She was advised to follow up in 6 months. She verbalizes understanding and agreement with this plan.    No orders of the defined types were placed in this encounter.    No orders of the defined types were placed in this encounter.     I spent 15 minutes with the patient. 50% of this time was spent counseling and educating patient on plan of care and medications.    Debbora Presto, FNP-C 04/24/2020, 3:03 PM [facility] [address] [place], [place] [address] [phone]

## 2020-05-05 ENCOUNTER — Other Ambulatory Visit: Payer: Self-pay | Admitting: Nurse Practitioner

## 2020-05-09 ENCOUNTER — Ambulatory Visit (INDEPENDENT_AMBULATORY_CARE_PROVIDER_SITE_OTHER): Payer: Medicare HMO | Admitting: Nurse Practitioner

## 2020-05-09 ENCOUNTER — Encounter: Payer: Self-pay | Admitting: Nurse Practitioner

## 2020-05-09 ENCOUNTER — Other Ambulatory Visit: Payer: Self-pay

## 2020-05-09 VITALS — BP 130/82 | HR 72 | Temp 98.0°F | Ht 62.0 in | Wt 256.4 lb

## 2020-05-09 DIAGNOSIS — R0602 Shortness of breath: Secondary | ICD-10-CM

## 2020-05-09 DIAGNOSIS — R600 Localized edema: Secondary | ICD-10-CM | POA: Diagnosis not present

## 2020-05-09 DIAGNOSIS — Z6841 Body Mass Index (BMI) 40.0 and over, adult: Secondary | ICD-10-CM | POA: Diagnosis not present

## 2020-05-09 DIAGNOSIS — R7303 Prediabetes: Secondary | ICD-10-CM

## 2020-05-09 DIAGNOSIS — I10 Essential (primary) hypertension: Secondary | ICD-10-CM | POA: Diagnosis not present

## 2020-05-09 MED ORDER — NEBIVOLOL HCL 5 MG PO TABS: 5.0000 mg | ORAL_TABLET | Freq: Every day | ORAL | 1 refills | Status: DC

## 2020-05-09 NOTE — Progress Notes (Signed)
I,Yamilka Roman [employer] as a Education administrator for [employer], FNP.,have documented all relevant documentation on the behalf of Minette Brine, FNP,as directed by  Minette Brine, FNP while in the presence of Minette Brine, [place]. This visit occurred during the SARS-CoV-2 public health emergency.  Safety protocols were in place, including screening questions prior to the visit, additional usage of staff PPE, and extensive cleaning of exam room while observing appropriate contact time as indicated for disinfecting solutions.  Subjective:     Patient ID: Carrie Figueroa , female    DOB: [birth year] , 74 y.o.   MRN: [id number]   Chief Complaint  Patient presents with  . Hypertension  . Prediabetes    HPI  Wt Readings from Last 3 Encounters: 05/09/20 : 256 lb 6.4 oz (116.3 kg) 04/24/20 : 259 lb (117.5 kg) 10/12/19 : 260 lb 8 oz (118.2 kg)   Hypertension This is a chronic problem. The current episode started more than 1 year ago. The problem is unchanged. The problem is controlled. Pertinent negatives include no anxiety, chest pain, headaches, palpitations, peripheral edema or shortness of breath (on exertion). There are no associated agents to hypertension. Risk factors for coronary artery disease include obesity and sedentary lifestyle. Past treatments include beta blockers, ACE inhibitors and diuretics. There are no compliance problems.  There is no history of angina or kidney disease. There is no history of chronic renal disease.     Past Medical History:  Diagnosis Date  . Hypertension      Family History  Problem Relation Age of Onset  . Heart disease Mother        CHF  . Diabetes Mother   . COPD Mother   . Hyperthyroidism Mother   . Dementia Mother   . Heart disease Father   . Diabetes Father   . Breast cancer Maternal Grandmother   . Breast cancer Sister      Current Outpatient Medications:  .  ARIPiprazole (ABILIFY) 2 MG tablet, TAKE 2 TABLETS BY MOUTH DAILY, Disp: 180 tablet,  Rfl: 2 .  Ascorbic Acid (VITAMIN C) 1000 MG tablet, Take 1,000 mg by mouth 2 (two) times a week., Disp: , Rfl:  .  aspirin EC 81 MG tablet, Take 81 mg by mouth daily., Disp: , Rfl:  .  Multiple Vitamin (MULTIVITAMIN WITH MINERALS) TABS tablet, Take 2 tablets by mouth daily., Disp: , Rfl:  .  nebivolol (BYSTOLIC) 5 MG tablet, Take 1 tablet (5 mg total) by mouth daily., Disp: 90 tablet, Rfl: 1 .  Olmesartan-amLODIPine-HCTZ 40-10-25 MG TABS, TAKE 1 TABLET BY MOUTH EVERY DAY, Disp: 90 tablet, Rfl: 1   No Known Allergies   Review of Systems  Constitutional: Negative for fatigue.  Respiratory: Negative for shortness of breath (on exertion).   Cardiovascular: Positive for leg swelling (nonpitting). Negative for chest pain and palpitations.  Neurological: Negative for dizziness and headaches.     Today's Vitals   05/09/20 1130  BP: 130/82  Pulse: 72  Temp: 98 F (36.7 C)  TempSrc: Oral  Weight: 256 lb 6.4 oz (116.3 kg)  Height: 5' 2"  (1.575 m)  PainSc: 7   PainLoc: Knee   Body mass index is 46.9 kg/m.   Objective:  Physical Exam Constitutional:      General: She is not in acute distress.    Appearance: Normal appearance.  Cardiovascular:     Rate and Rhythm: Normal rate and regular rhythm.     Pulses: Normal pulses.     Heart sounds:  Normal heart sounds. No murmur heard.   Pulmonary:     Effort: Pulmonary effort is normal. No respiratory distress.     Breath sounds: Normal breath sounds.  Musculoskeletal:     Right lower leg: Edema present.     Left lower leg: Edema present.  Neurological:     General: No focal deficit present.     Mental Status: She is alert.     Cranial Nerves: No cranial nerve deficit.  Psychiatric:        Mood and Affect: Mood normal.        Behavior: Behavior normal.        Thought Content: Thought content normal.        Judgment: Judgment normal.         Assessment And Plan:     1. Essential hypertension, benign . B/P is fairly controlled.   . CMP ordered to check renal function.  . The importance of regular exercise and dietary modification was stressed to the patient.  - nebivolol (BYSTOLIC) 5 MG tablet; Take 1 tablet (5 mg total) by mouth daily.  Dispense: 90 tablet; Refill: 1 - CMP14+EGFR  2. Prediabetes  Chronic, controlled  Continue with current medications  Encouraged to limit intake of sugary foods and drinks  Encouraged to increase physical activity to 150 minutes per week as tolerated - Hemoglobin A1c  3. Shortness of breath  Intermittent dyspnea  No abnormal findings on physical exam other than bilateral lower extremity edema - Brain natriuretic peptide  4. Bilateral lower extremity edema  She is encouraged to elevated her feet   Avoid high salt intake  - Brain natriuretic peptide     Patient was given opportunity to ask questions. Patient verbalized understanding of the plan and was able to repeat key elements of the plan. All questions were answered to their satisfaction.  Minette Brine, FNP   I, Minette Brine, FNP, have reviewed all documentation for this visit. The documentation on 05/13/20 for the exam, diagnosis, procedures, and orders are all accurate and complete.  THE PATIENT IS ENCOURAGED TO PRACTICE SOCIAL DISTANCING DUE TO THE COVID-19 PANDEMIC.

## 2020-05-10 LAB — BRAIN NATRIURETIC PEPTIDE: BNP: 11.3 pg/mL (ref 0.0–100.0)

## 2020-05-10 LAB — CMP14+EGFR
ALT: 12 IU/L (ref 0–32)
AST: 14 IU/L (ref 0–40)
Albumin/Globulin Ratio: 1.7 (ref 1.2–2.2)
Albumin: 4.3 g/dL (ref 3.7–4.7)
Alkaline Phosphatase: 81 IU/L (ref 48–121)
BUN/Creatinine Ratio: 17 (ref 12–28)
BUN: 17 mg/dL (ref 8–27)
Bilirubin Total: 0.5 mg/dL (ref 0.0–1.2)
CO2: 27 mmol/L (ref 20–29)
Calcium: 9.2 mg/dL (ref 8.7–10.3)
Chloride: 99 mmol/L (ref 96–106)
Creatinine, Ser: 0.99 mg/dL (ref 0.57–1.00)
GFR calc Af Amer: 65 mL/min/{1.73_m2} (ref >59)
GFR calc non Af Amer: 57 mL/min/{1.73_m2} — ABNORMAL LOW (ref >59)
Globulin, Total: 2.5 g/dL (ref 1.5–4.5)
Glucose: 104 mg/dL — ABNORMAL HIGH (ref 65–99)
Potassium: 3.5 mmol/L (ref 3.5–5.2)
Sodium: 139 mmol/L (ref 134–144)
Total Protein: 6.8 g/dL (ref 6.0–8.5)

## 2020-05-10 LAB — HEMOGLOBIN A1C
Est. average glucose Bld gHb Est-mCnc: 114 mg/dL
Hgb A1c MFr Bld: 5.6 % (ref 4.8–5.6)

## 2020-05-22 ENCOUNTER — Other Ambulatory Visit: Payer: Medicare HMO

## 2020-06-13 ENCOUNTER — Encounter: Payer: Self-pay | Admitting: Dermatology

## 2020-06-13 ENCOUNTER — Other Ambulatory Visit: Payer: Self-pay

## 2020-06-13 ENCOUNTER — Ambulatory Visit: Payer: Medicare HMO | Admitting: Dermatology

## 2020-06-13 DIAGNOSIS — L820 Inflamed seborrheic keratosis: Secondary | ICD-10-CM

## 2020-06-20 DIAGNOSIS — H25812 Combined forms of age-related cataract, left eye: Secondary | ICD-10-CM | POA: Diagnosis not present

## 2020-06-20 DIAGNOSIS — H2512 Age-related nuclear cataract, left eye: Secondary | ICD-10-CM | POA: Diagnosis not present

## 2020-07-05 DIAGNOSIS — Z1159 Encounter for screening for other viral diseases: Secondary | ICD-10-CM | POA: Diagnosis not present

## 2020-07-10 DIAGNOSIS — Z86010 Personal history of colonic polyps: Secondary | ICD-10-CM | POA: Diagnosis not present

## 2020-07-10 LAB — HM COLONOSCOPY

## 2020-07-12 NOTE — Progress Notes (Signed)
   New Patient   Subjective  Carrie Figueroa is a 74 y.o. female who presents for the following: Skin Problem (LEFT LOWER LEG ISK? X YEARS SORE RED PER PATIENT).  Left lower leg  Location:  Duration:  Quality:  Associated Signs/Symptoms: Modifying Factors:  Severity:  Timing: Context:    The following portions of the chart were reviewed this encounter and updated as appropriate: Tobacco  Allergies  Meds  Problems  Med Hx  Surg Hx  Fam Hx      Objective  Well appearing patient in no apparent distress; mood and affect are within normal limits.  A focused examination was performed including lower left leg. Relevant physical exam findings are noted in the Assessment and Plan.   Assessment & Plan  Seborrheic keratosis, inflamed Left Lower Leg - Anterior  Okay to leave if stable (today the patient states there is no significant discomfort).

## 2020-07-13 ENCOUNTER — Encounter: Payer: Self-pay | Admitting: Nurse Practitioner

## 2020-07-16 ENCOUNTER — Encounter: Payer: Self-pay | Admitting: Dermatology

## 2020-07-18 DIAGNOSIS — H25811 Combined forms of age-related cataract, right eye: Secondary | ICD-10-CM | POA: Diagnosis not present

## 2020-07-18 DIAGNOSIS — H25011 Cortical age-related cataract, right eye: Secondary | ICD-10-CM | POA: Diagnosis not present

## 2020-07-18 DIAGNOSIS — H2511 Age-related nuclear cataract, right eye: Secondary | ICD-10-CM | POA: Diagnosis not present

## 2020-07-27 NOTE — Addendum Note (Signed)
Addended by: Lavonna Monarch on: 07/27/2020 11:05 AM   Modules accepted: Level of Service

## 2020-08-15 ENCOUNTER — Other Ambulatory Visit (HOSPITAL_COMMUNITY): Payer: Self-pay | Admitting: Internal Medicine

## 2020-08-15 ENCOUNTER — Ambulatory Visit: Payer: Medicare HMO | Attending: Internal Medicine

## 2020-08-15 DIAGNOSIS — Z23 Encounter for immunization: Secondary | ICD-10-CM

## 2020-08-15 NOTE — Progress Notes (Signed)
   Covid-19 Vaccination Clinic  Name:  Carrie Figueroa    MRN: [id number] DOB: Mar 31, 1946  08/15/2020  [name] was observed post Covid-19 immunization for 15 minutes without incident. She was provided with Vaccine Information Sheet and instruction to access the V-Safe system.   [name] was instructed to call 911 with any severe reactions post vaccine: Marland Kitchen Difficulty breathing  . Swelling of face and throat  . A fast heartbeat  . A bad rash all over body  . Dizziness and weakness   Immunizations Administered    No immunizations on file.

## 2020-10-25 ENCOUNTER — Ambulatory Visit: Payer: Medicare HMO | Admitting: Neurology

## 2020-10-25 NOTE — Progress Notes (Deleted)
[facility]  PATIENT: Carrie Figueroa DOB: 02-22-1946  REFERRING DOCTOR OR PCP: Minette Brine, FNP SOURCE: Patient, notes from primary care, lab reports.  _________________________________   HISTORICAL  CHIEF COMPLAINT:  No chief complaint on file.   HISTORY OF PRESENT ILLNESS:  She is a 75 year old woman with visual hallucinations  Update 10/12/2019: She is having more visual hallucinations.   She often sees animals or people or spiders on the walls, ceiling or curtains.    The Abilify helped her sleep but did not help her hallucinations.  She does think it does calm her before bedtime.  She has no threatening auditory hallucinations   She does all her ADL'S around the house.   She does drive just to the store or doctor's office only during the day and in decent weather.  She has some depression but no history of bipolar disease or schizophenia.   She sleeps better at night since starting Abilify but still might wake up a couple hours later.      I personally reviewed her MRi from 08/24/2019 and it was normal for age with just mild chronic microvascular ischemic change [name], [address])  She has cataracts and she states her ophthalmologist will do surgery in May.     She gets headaches in the mornings.   She snores.  She had a sleep study but does not have OSA.  See below for details.    From Consult  08/02/19: I had the pleasure of seeing patient, Carrie Figueroa, at [facility] for neurologic consultation regarding her visual hallucinations.  She is a 75 year old woman who has had visual hallucinations for several months.   Initially, she would see ants and flies.    Now she sees animals a lot.   She saw a tiger on the table and mistook a pressure cooker cord for a snake.   She has seen a woman after one nap and   These seem very real to her and the tiger and the snake were scary for her.    Some have occurred when she opens her eyes  after a nap or when laying down in bed.    Often she will see spiders in the cracks of the roof when she tries to fall asleep.     She has cataracts and surgery was recommended.     In April, she lost her mother who died in isolation at a nursing home and seeing her for 5 weeks was very upsetting to her. She was seeing her daily for months before in the nursing home.   Her mom had dementia.    She could barely talk to others at the funeral due to social distancing.   She has been more isolated due to Covid-19.       She has not had any known episodes of REM behavior disorder.    She does not sleep well as neighbors are loud.   She had a PSG several years ago and was told she did not have OSA though an earlier one reportedly did.   PSG 04/28/2017 showed rapid sleep onset of 3 minutes, AHI = 1.6 (10 in REM), 89% sleep efficiency.  There was no stage N3 sleep..  She had soft snoring.   PLMSI was 6.4 but associated arousal index was only 0.8 recent blood work shows normal vitamin B12 and T. pallidum screening.  She is walking well and has no recent falls (fell in  2014 and hit head on linoleum floor).     Balance is slightly worse than a couple years ago but no much worse.    Her medications are only a BP combination (Olmesartan-amlodipine-HCTZ), ASA and vitamins.     Montreal Cognitive Assessment  08/02/2019  Visuospatial/ Executive (0/5) 4  Naming (0/3) 3  Attention: Read list of digits (0/2) 2  Attention: Read list of letters (0/1) 1  Attention: Serial 7 subtraction starting at 100 (0/3) 1  Language: Repeat phrase (0/2) 2  Language : Fluency (0/1) 0  Abstraction (0/2) 1  Delayed Recall (0/5) 3  Orientation (0/6) 6  Total 23  Adjusted Score (based on education) 24       REVIEW OF SYSTEMS: Constitutional: No fevers, chills, sweats, or change in appetite Eyes: No visual changes, double vision, eye pain Ear, nose and throat: No hearing loss, ear pain, nasal congestion, sore  throat Cardiovascular: No chest pain, palpitations Respiratory: No shortness of breath at rest or with exertion.   No wheezes GastrointestinaI: No nausea, vomiting, diarrhea, abdominal pain, fecal incontinence Genitourinary: No dysuria, urinary retention or frequency.  No nocturia. Musculoskeletal: No neck pain, back pain Integumentary: No rash, pruritus, skin lesions Neurological: as above Psychiatric: No depression at this time.  No anxiety Endocrine: No palpitations, diaphoresis, change in appetite, change in weigh or increased thirst Hematologic/Lymphatic: No anemia, purpura, petechiae. Allergic/Immunologic: No itchy/runny eyes, nasal congestion, recent allergic reactions, rashes  ALLERGIES: No Known Allergies  HOME MEDICATIONS:  Current Outpatient Medications:  .  ARIPiprazole (ABILIFY) 2 MG tablet, TAKE 2 TABLETS BY MOUTH DAILY, Disp: 180 tablet, Rfl: 2 .  Ascorbic Acid (VITAMIN C) 1000 MG tablet, Take 1,000 mg by mouth 2 (two) times a week., Disp: , Rfl:  .  aspirin EC 81 MG tablet, Take 81 mg by mouth daily., Disp: , Rfl:  .  Multiple Vitamin (MULTIVITAMIN WITH MINERALS) TABS tablet, Take 2 tablets by mouth daily., Disp: , Rfl:  .  nebivolol (BYSTOLIC) 5 MG tablet, Take 1 tablet (5 mg total) by mouth daily., Disp: 90 tablet, Rfl: 1 .  Olmesartan-amLODIPine-HCTZ 40-10-25 MG TABS, TAKE 1 TABLET BY MOUTH EVERY DAY, Disp: 90 tablet, Rfl: 1  PAST MEDICAL HISTORY: Past Medical History:  Diagnosis Date  . Hypertension     PAST SURGICAL HISTORY: Past Surgical History:  Procedure Laterality Date  . BREAST CYST ASPIRATION  11/10/2013  . BUNIONECTOMY    . tubal plasty  1974    FAMILY HISTORY: Family History  Problem Relation Age of Onset  . Heart disease Mother        CHF  . Diabetes Mother   . COPD Mother   . Hyperthyroidism Mother   . Dementia Mother   . Heart disease Father   . Diabetes Father   . Breast cancer Maternal Grandmother   . Breast cancer Sister      SOCIAL HISTORY:  Social History   Socioeconomic History  . Marital status: Single    Spouse name: Not on file  . Number of children: Not on file  . Years of education: Not on file  . Highest education level: Not on file  Occupational History  . Occupation: Med Engineer, production: RETIRED  Tobacco Use  . Smoking status: Former Smoker    Packs/day: 0.10    Years: 1.00    Pack years: 0.10    Types: Cigarettes  . Smokeless tobacco: Never Used  Vaping Use  . Vaping Use: Never used  Substance and Sexual Activity  . Alcohol use: Not Currently  . Drug use: No  . Sexual activity: Not Currently  Other Topics Concern  . Not on file  Social History Narrative   Right handed   Lives alone.     Caffeine use: coffee sometimes   Social Determinants of Radio broadcast assistant Strain: Not on file  Food Insecurity: Not on file  Transportation Needs: Not on file  Physical Activity: Not on file  Stress: Not on file  Social Connections: Not on file  Intimate Partner Violence: Not on file     PHYSICAL EXAM  There were no vitals filed for this visit.  There is no height or weight on file to calculate BMI.   General: The patient is well-developed and well-nourished and in no acute distress  HEENT:  Head is [place]/AT.  Sclera are anicteric.  Visual acuity is 20/50 OD and 20/100 OS.  Skin: Extremities are without rash or  edema.   Neurologic Exam  Mental status: The patient is alert and oriented x 3 at the time of the examination.  In conversation, memory and focus appear to be fairly normal.    Speech is normal.  Cranial nerves: Extraocular movements are full. There is good facial sensation to soft touch bilaterally.Facial strength is normal.  Trapezius and sternocleidomastoid strength is normal. No dysarthria is noted.  No obvious hearing deficits are noted.  Motor:  Muscle bulk is normal.   Tone is normal. Strength is  5 / 5 in all 4 extremities.   Sensory: Sensory  testing is intact to pinprick, soft touch and vibration sensation in all 4 extremities.  Coordination: Cerebellar testing reveals good finger-nose-finger and heel-to-shin bilaterally.  Gait and station: Station is normal.   Gait is normal. Tandem gait is slightly wide.  She had mild retropulsion.  Romberg is negative.   Reflexes: Deep tendon reflexes are symmetric and normal bilaterally.   Plantar responses are flexor.    DIAGNOSTIC DATA (LABS, IMAGING, TESTING) - I reviewed patient records, labs, notes, testing and imaging myself where available.  Lab Results  Component Value Date   WBC 7.5 09/09/2018   HGB 13.3 09/09/2018   HCT 40.1 09/09/2018   MCV 85 09/09/2018   PLT 338 09/09/2018      Component Value Date/Time   NA 139 05/09/2020 1242   K 3.5 05/09/2020 1242   CL 99 05/09/2020 1242   CO2 27 05/09/2020 1242   GLUCOSE 104 (H) 05/09/2020 1242   GLUCOSE 116 (H) 05/28/2014 2155   BUN 17 05/09/2020 1242   CREATININE 0.99 05/09/2020 1242   CALCIUM 9.2 05/09/2020 1242   PROT 6.8 05/09/2020 1242   ALBUMIN 4.3 05/09/2020 1242   AST 14 05/09/2020 1242   ALT 12 05/09/2020 1242   ALKPHOS 81 05/09/2020 1242   BILITOT 0.5 05/09/2020 1242   GFRNONAA 57 (L) 05/09/2020 1242   GFRAA 65 05/09/2020 1242   Lab Results  Component Value Date   CHOL 157 05/03/2019   HDL 59 05/03/2019   LDLCALC 87 05/03/2019   TRIG 56 05/03/2019   CHOLHDL 2.7 05/03/2019   Lab Results  Component Value Date   HGBA1C 5.6 05/09/2020   Lab Results  Component Value Date   YDXAJOIN86 7,672 (H) 05/03/2019   Lab Results  Component Value Date   TSH 1.870 08/02/2019       ASSESSMENT AND PLAN  No diagnosis found.  1.   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  She is advised to proceed with her cataract surgery in the hope that there will be some improvement. 2.   Continue Abilify as it helps her sleep and calms her at  night. 3.   Return in 6 months or sooner if there are new or worsening neurologic symptoms.  Richard A. Felecia Shelling, MD, [facility] 10/28/2033, 5:97 AM Certified in Neurology, Clinical Neurophysiology, Sleep Medicine and Neuroimaging  [facility] [address] [place], [place] [address] [phone]

## 2020-11-12 ENCOUNTER — Other Ambulatory Visit: Payer: Self-pay | Admitting: Internal Medicine

## 2020-11-12 DIAGNOSIS — Z1231 Encounter for screening mammogram for malignant neoplasm of breast: Secondary | ICD-10-CM

## 2020-11-14 ENCOUNTER — Encounter: Payer: Medicare HMO | Admitting: Nurse Practitioner

## 2020-12-07 ENCOUNTER — Other Ambulatory Visit: Payer: Self-pay | Admitting: Neurology

## 2020-12-09 ENCOUNTER — Other Ambulatory Visit: Payer: Self-pay | Admitting: Nurse Practitioner

## 2021-01-01 ENCOUNTER — Ambulatory Visit
Admission: RE | Admit: 2021-01-01 | Discharge: 2021-01-01 | Disposition: A | Discharge status: Home / Self Care | Payer: Medicare HMO | Source: Ambulatory Visit | Attending: Internal Medicine | Admitting: Internal Medicine

## 2021-01-01 ENCOUNTER — Other Ambulatory Visit: Payer: Self-pay

## 2021-01-01 DIAGNOSIS — Z1231 Encounter for screening mammogram for malignant neoplasm of breast: Secondary | ICD-10-CM

## 2021-01-02 DIAGNOSIS — M25562 Pain in left knee: Secondary | ICD-10-CM | POA: Diagnosis not present

## 2021-01-02 DIAGNOSIS — M1712 Unilateral primary osteoarthritis, left knee: Secondary | ICD-10-CM | POA: Diagnosis not present

## 2021-01-17 ENCOUNTER — Ambulatory Visit: Payer: Medicare HMO

## 2021-01-17 ENCOUNTER — Other Ambulatory Visit: Payer: Self-pay | Admitting: Nurse Practitioner

## 2021-01-17 DIAGNOSIS — I10 Essential (primary) hypertension: Secondary | ICD-10-CM

## 2021-02-14 ENCOUNTER — Ambulatory Visit: Payer: Medicare HMO

## 2021-03-06 ENCOUNTER — Ambulatory Visit: Payer: Medicare HMO

## 2021-03-06 ENCOUNTER — Telehealth: Payer: Self-pay

## 2021-03-06 NOTE — Telephone Encounter (Signed)
This nurse called patient in regards to missing scheduled AWV. Rescheduled for 04/03/2021

## 2021-03-13 ENCOUNTER — Encounter: Payer: Self-pay | Admitting: Neurology

## 2021-03-13 ENCOUNTER — Ambulatory Visit: Payer: Medicare HMO | Admitting: Neurology

## 2021-03-13 VITALS — BP 146/79 | HR 61 | Ht 62.0 in | Wt 257.0 lb

## 2021-03-13 DIAGNOSIS — R413 Other amnesia: Secondary | ICD-10-CM | POA: Diagnosis not present

## 2021-03-13 DIAGNOSIS — F32A Depression, unspecified: Secondary | ICD-10-CM | POA: Diagnosis not present

## 2021-03-13 DIAGNOSIS — F419 Anxiety disorder, unspecified: Secondary | ICD-10-CM | POA: Diagnosis not present

## 2021-03-13 DIAGNOSIS — R2689 Other abnormalities of gait and mobility: Secondary | ICD-10-CM | POA: Diagnosis not present

## 2021-03-13 DIAGNOSIS — R441 Visual hallucinations: Secondary | ICD-10-CM | POA: Diagnosis not present

## 2021-03-13 MED ORDER — CLONAZEPAM 0.5 MG PO TABS: 0.5000 mg | ORAL_TABLET | Freq: Two times a day (BID) | ORAL | 3 refills | Status: AC | PRN

## 2021-03-13 NOTE — Progress Notes (Signed)
[facility]  PATIENT: Carrie Figueroa DOB: 03/05/1946  REFERRING DOCTOR OR PCP: Minette Brine, FNP SOURCE: Patient, notes from primary care, lab reports.  _________________________________   HISTORICAL  CHIEF COMPLAINT:  Chief Complaint  Patient presents with   Follow-up    RM 13, alone.  Last seen 04/24/2020 by AL,NP. 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 Having same sx, sx more severe at times.     HISTORY OF PRESENT ILLNESS:  She is a 75 year old woman with visual hallucinations  Update 03/13/2021: She still has visual hallucinations despite having her cataract surgery (bilat)   She does feel vision improved after the surgery though hallucinations remained.   She is unsure if something is real or not.   She sees people and snakes butno longer sees other animals or spiders.    The Abilify helped her sleep but did not help her hallucinations.  She does think it does calm her before bedtime.  She has no threatening auditory hallucinations.  All of her hallucinations are at night or while still in bed in the mornings.    She feels her focus is reduced.  She has some problems with directions whike driving.    She does all her ADL'S around the house.   She does drive just to the store or doctor's office only during the day and in decent weather.  She does not have a bed partner but she thinks she may have active dreams.   She has gotten out of her bed a few times in her sleep and has woken up sitting up in bed multiple times thinking possible kicking first. -- once running out of her room thinking she was being attacked.    She feels gait is sometimes off balanced but no major gait issues.     She has some depression but no history of bipolar disease or schizophenia.     MRi from 08/24/2019 and it was normal for age with just mild chronic microvascular ischemic change   She gets headaches in the mornings.   She snores.  She had a sleep study but does not have  OSA.  PSG 04/28/2017 showed rapid sleep onset of 3 minutes, AHI = 1.6 (10 in REM), 89% sleep efficiency.  There was no stage N3 sleep..  She had soft snoring.   PLMSI was 6.4 but associated arousal index was only 0.8     From Consult  08/02/19: She started having visual hallucinations in 2020s.   Initially, she would see ants and flies.    Now she sees animals a lot.   She saw a tiger on the table and mistook a pressure cooker cord for a snake.   She has seen a woman after one nap and   These seem very real to her and the tiger and the snake were scary for her.    Some have occurred when she opens her eyes after a nap or when laying down in bed.    Often she will see spiders in the cracks of the roof when she tries to fall asleep.     She has cataracts and surgery was recommended.     In April, she lost her mother who died in isolation at a nursing home and seeing her for 5 weeks was very upsetting to her. She was seeing her daily for months before in the nursing home.   Her mom had dementia.    She could barely talk to  others at the funeral due to social distancing.   She has been more isolated due to Covid-19.       recent blood work shows normal vitamin B12 and T. pallidum screening.     Montreal Cognitive Assessment  08/02/2019  Visuospatial/ Executive (0/5) 4  Naming (0/3) 3  Attention: Read list of digits (0/2) 2  Attention: Read list of letters (0/1) 1  Attention: Serial 7 subtraction starting at 100 (0/3) 1  Language: Repeat phrase (0/2) 2  Language : Fluency (0/1) 0  Abstraction (0/2) 1  Delayed Recall (0/5) 3  Orientation (0/6) 6  Total 23  Adjusted Score (based on education) 24       REVIEW OF SYSTEMS: Constitutional: No fevers, chills, sweats, or change in appetite Eyes: No visual changes, double vision, eye pain Ear, nose and throat: No hearing loss, ear pain, nasal congestion, sore throat Cardiovascular: No chest pain, palpitations Respiratory:  No shortness of breath  at rest or with exertion.   No wheezes GastrointestinaI: No nausea, vomiting, diarrhea, abdominal pain, fecal incontinence Genitourinary:  No dysuria, urinary retention or frequency.  No nocturia. Musculoskeletal:  No neck pain, back pain Integumentary: No rash, pruritus, skin lesions Neurological: as above Psychiatric: No depression at this time.  No anxiety Endocrine: No palpitations, diaphoresis, change in appetite, change in weigh or increased thirst Hematologic/Lymphatic:  No anemia, purpura, petechiae. Allergic/Immunologic: No itchy/runny eyes, nasal congestion, recent allergic reactions, rashes  ALLERGIES: No Known Allergies  HOME MEDICATIONS:  Current Outpatient Medications:    ARIPiprazole (ABILIFY) 2 MG tablet, TAKE 2 TABLETS BY MOUTH EVERY DAY, Disp: 180 tablet, Rfl: 2   aspirin EC 81 MG tablet, Take 81 mg by mouth daily., Disp: , Rfl:    clonazePAM (KLONOPIN) 0.5 MG tablet, Take 1 tablet (0.5 mg total) by mouth 2 (two) times daily as needed for anxiety., Disp: 30 tablet, Rfl: 3   COVID-19 mRNA vaccine, Moderna, 100 MCG/0.5ML injection, USE AS DIRECTED, Disp: .25 mL, Rfl: 0   Multiple Vitamin (MULTIVITAMIN WITH MINERALS) TABS tablet, Take 2 tablets by mouth daily., Disp: , Rfl:    nebivolol (BYSTOLIC) 5 MG tablet, TAKE 1 TABLET BY MOUTH EVERY DAY, Disp: 90 tablet, Rfl: 1   Olmesartan-amLODIPine-HCTZ 40-10-25 MG TABS, TAKE 1 TABLET BY MOUTH EVERY DAY, Disp: 90 tablet, Rfl: 1   Ascorbic Acid (VITAMIN C) 1000 MG tablet, Take 1,000 mg by mouth 2 (two) times a week. (Patient not taking: Reported on 03/13/2021), Disp: , Rfl:   PAST MEDICAL HISTORY: Past Medical History:  Diagnosis Date   Hypertension     PAST SURGICAL HISTORY: Past Surgical History:  Procedure Laterality Date   BREAST CYST ASPIRATION  11/10/2013   BUNIONECTOMY     tubal plasty  1974    FAMILY HISTORY: Family History  Problem Relation Age of Onset   Heart disease Mother        CHF   Diabetes Mother     COPD Mother    Hyperthyroidism Mother    Dementia Mother    Heart disease Father    Diabetes Father    Breast cancer Maternal Grandmother    Breast cancer Sister     SOCIAL HISTORY:  Social History   Socioeconomic History   Marital status: Single    Spouse name: Not on file   Number of children: Not on file   Years of education: Not on file   Highest education level: Not on file  Occupational History   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     Caffeine use: coffee sometimes   Social Determinants of Radio broadcast assistant Strain: Not on file  Food Insecurity: Not on file  Transportation Needs: Not on file  Physical Activity: Not on file  Stress: Not on file  Social Connections: Not on file  Intimate Partner Violence: Not on file     PHYSICAL EXAM  Vitals:   03/13/21 1044  BP: (!) 146/79  Pulse: 61  Weight: 257 lb (116.6 kg)  Height: 5\' 2"  (1.575 m)    Body mass index is 47.01 kg/m.   General: The patient is well-developed and well-nourished and in no acute distress  HEENT:  Head is [place]/AT.  Sclera are anicteric.  Visual acuity is 20/50 OD and 20/100 OS.  Skin: Extremities are without rash or  edema.   Neurologic Exam  Mental status: The patient is alert and oriented x 3 at the time of the examination.  In conversation, memory and focus appear to be fairly normal.    Speech is normal.  Cranial nerves: Extraocular movements are full. There is good facial sensation to soft touch bilaterally.Facial strength is normal.  Trapezius and sternocleidomastoid strength is  normal. No dysarthria is noted.  No obvious hearing deficits are noted.  Motor:  Muscle bulk is normal.   Tone is normal. Strength is  5 / 5 in all 4 extremities.   Sensory: Sensory testing is intact to pinprick, soft touch and vibration sensation in all 4 extremities.  Coordination: Cerebellar testing reveals good finger-nose-finger and heel-to-shin bilaterally.  Gait and station: Station is normal.   Gait is normal. Tandem gait is slightly wide.  She had mild retropulsion.  Romberg is negative.   Reflexes: Deep tendon reflexes are symmetric and normal bilaterally.   Plantar responses are flexor.    DIAGNOSTIC DATA (LABS, IMAGING, TESTING) - I reviewed patient records, labs, notes, testing and imaging myself where available.  Lab Results  Component Value Date   WBC 7.5 09/09/2018   HGB 13.3 09/09/2018   HCT 40.1 09/09/2018   MCV 85 09/09/2018   PLT 338 09/09/2018      Component Value Date/Time   NA 139 05/09/2020 1242   K 3.5 05/09/2020 1242   CL 99 05/09/2020 1242   CO2 27 05/09/2020 1242   GLUCOSE 104 (H) 05/09/2020 1242   GLUCOSE 116 (H) 05/28/2014 2155   BUN 17 05/09/2020 1242   CREATININE 0.99 05/09/2020 1242   CALCIUM 9.2 05/09/2020 1242   PROT 6.8 05/09/2020 1242   ALBUMIN 4.3 05/09/2020 1242   AST 14 05/09/2020 1242   ALT 12 05/09/2020 1242   ALKPHOS 81 05/09/2020 1242   BILITOT 0.5 05/09/2020 1242   GFRNONAA 57 (L) 05/09/2020 1242   GFRAA 65 05/09/2020 1242   Lab Results  Component Value Date   CHOL 157 05/03/2019   HDL 59 05/03/2019   LDLCALC 87 05/03/2019   TRIG 56 05/03/2019   CHOLHDL 2.7 05/03/2019   Lab Results  Component Value Date   HGBA1C 5.6 05/09/2020   Lab Results  Component Value Date   [id number] 7,902 (H) 05/03/2019   Lab Results  Component Value Date   TSH 1.870 08/02/2019       ASSESSMENT AND PLAN  Visual hallucination  Anxiety and depression  Memory loss  Retropulsion  1.   Visual hallucinations continued  despite improvement of vision after cataract surgery.  Thus, Sherran Needs syndrome is less likely.  With her current description of all of her visual hallucinations occurring while in bed, presumably while asleep and retropulsion on exam, she may RBD associated have Lewy Body dsease - some people have REM behavio disorder many months or even years before other symptoms.   She had retropulsion when posture disturbed but no other parkinsonian symptom.   2.   Change abilify to clonazepam 0.5 mg --- we can adjust dose if needed 3.   Return in 6 months or sooner if there are new or worsening neurologic symptoms.  Pilar Corrales A. Felecia Shelling, MD, [facility] 12/30/7351, 29:92 PM Certified in Neurology, Clinical Neurophysiology, Sleep Medicine and Neuroimaging  [facility] [address] [place], [place] [address] [phone]

## 2021-03-19 DIAGNOSIS — Z961 Presence of intraocular lens: Secondary | ICD-10-CM | POA: Diagnosis not present

## 2021-03-21 ENCOUNTER — Ambulatory Visit: Payer: Self-pay | Admitting: Nurse Practitioner

## 2021-03-26 ENCOUNTER — Other Ambulatory Visit: Payer: Self-pay

## 2021-03-26 DIAGNOSIS — I10 Essential (primary) hypertension: Secondary | ICD-10-CM

## 2021-03-26 MED ORDER — OLMESARTAN MEDOXOMIL 40 MG PO TABS: 40.0000 mg | ORAL_TABLET | Freq: Every day | ORAL | 1 refills | Status: AC

## 2021-03-26 MED ORDER — AMLODIPINE BESYLATE 10 MG PO TABS: 10.0000 mg | ORAL_TABLET | Freq: Every day | ORAL | 1 refills | Status: AC

## 2021-03-26 MED ORDER — HYDROCHLOROTHIAZIDE 25 MG PO TABS: 25.0000 mg | ORAL_TABLET | Freq: Every day | ORAL | 1 refills | Status: AC

## 2021-04-03 ENCOUNTER — Telehealth: Payer: Self-pay

## 2021-04-03 ENCOUNTER — Ambulatory Visit: Payer: Medicare HMO

## 2021-04-03 NOTE — Telephone Encounter (Signed)
This nurse attempted to call patient three times for our scheduled telephonic AWV. Called at [place], [place], and 0840. Message left for patient to call back in order to reschedule for another time.

## 2021-04-09 ENCOUNTER — Telehealth: Payer: Self-pay | Admitting: Nurse Practitioner

## 2021-04-09 NOTE — Telephone Encounter (Signed)
Left message for patient to call back and schedule Medicare Annual Wellness Visit (AWV) either virtually or in office.   Last AWV 09/28/19  please schedule at [facility] with [facility]    This should be a 45 minute visit.

## 2021-05-06 ENCOUNTER — Telehealth: Payer: Self-pay | Admitting: Nurse Practitioner

## 2021-05-06 NOTE — Telephone Encounter (Signed)
Left message for patient to call back and schedule Medicare Annual Wellness Visit (AWV) either virtually or in office.   Left both my jabber number [phone] and office number    Last AWV 09/28/19 please schedule at anytime with [facility]    This should be a 45 minute visit.

## 2021-05-08 ENCOUNTER — Other Ambulatory Visit: Payer: Self-pay | Admitting: Nurse Practitioner

## 2021-05-08 DIAGNOSIS — I10 Essential (primary) hypertension: Secondary | ICD-10-CM

## 2021-06-06 ENCOUNTER — Telehealth: Payer: Self-pay | Admitting: Nurse Practitioner

## 2021-06-06 NOTE — Telephone Encounter (Signed)
I spoke with patient to schedule AWV.  She stated she moved to [place] in July.  Please remove PCP.

## 2021-06-17 ENCOUNTER — Ambulatory Visit: Payer: Medicare HMO | Admitting: Dermatology

## 2021-09-12 ENCOUNTER — Ambulatory Visit: Payer: Medicare HMO | Admitting: Family Medicine

## 2021-09-21 ENCOUNTER — Other Ambulatory Visit: Payer: Self-pay | Admitting: Nurse Practitioner

## 2021-09-21 DIAGNOSIS — I10 Essential (primary) hypertension: Secondary | ICD-10-CM

## 2021-11-12 ENCOUNTER — Other Ambulatory Visit: Payer: Self-pay | Admitting: Nurse Practitioner

## 2021-11-12 DIAGNOSIS — I10 Essential (primary) hypertension: Secondary | ICD-10-CM

## 2021-12-09 ENCOUNTER — Other Ambulatory Visit: Payer: Self-pay | Admitting: Nurse Practitioner

## 2021-12-09 DIAGNOSIS — I10 Essential (primary) hypertension: Secondary | ICD-10-CM

## 2022-05-15 NOTE — Progress Notes (Signed)
This encounter was created in error - please disregard.

## 2023-11-13 ENCOUNTER — Other Ambulatory Visit (HOSPITAL_COMMUNITY): Payer: Self-pay
